# Patient Record
Sex: Male | Born: 1940 | Race: White | Hispanic: No | Marital: Married | Smoking: Never smoker
Health system: Southern US, Community
[De-identification: ages and names within clinical notes are randomized; demographics above are authoritative.]

## PROBLEM LIST (undated history)

## (undated) DIAGNOSIS — I729 Aneurysm of unspecified site: Secondary | ICD-10-CM

## (undated) DIAGNOSIS — R413 Other amnesia: Secondary | ICD-10-CM

## (undated) DIAGNOSIS — I219 Acute myocardial infarction, unspecified: Secondary | ICD-10-CM

## (undated) DIAGNOSIS — I2510 Atherosclerotic heart disease of native coronary artery without angina pectoris: Secondary | ICD-10-CM

## (undated) DIAGNOSIS — I4891 Unspecified atrial fibrillation: Secondary | ICD-10-CM

## (undated) DIAGNOSIS — C801 Malignant (primary) neoplasm, unspecified: Secondary | ICD-10-CM

## (undated) HISTORY — PX: OTHER SURGICAL HISTORY: SHX169

## (undated) HISTORY — PX: CORONARY ARTERY BYPASS GRAFT: SHX141

---

## 2004-08-29 ENCOUNTER — Inpatient Hospital Stay: Payer: Self-pay | Admitting: Rheumatology

## 2005-09-14 ENCOUNTER — Ambulatory Visit: Payer: Self-pay | Admitting: General Surgery

## 2010-03-04 ENCOUNTER — Ambulatory Visit: Payer: Self-pay | Admitting: Vascular Surgery

## 2010-03-10 ENCOUNTER — Inpatient Hospital Stay: Payer: Self-pay | Admitting: Internal Medicine

## 2010-05-19 ENCOUNTER — Ambulatory Visit: Payer: Self-pay | Admitting: General Surgery

## 2011-06-13 ENCOUNTER — Ambulatory Visit: Payer: Self-pay

## 2011-09-25 ENCOUNTER — Other Ambulatory Visit: Payer: Self-pay | Admitting: Vascular Surgery

## 2011-09-25 LAB — CREATININE, SERUM: EGFR (African American): >60

## 2011-09-25 LAB — BUN: BUN: 18 mg/dL (ref 7–18)

## 2011-09-27 ENCOUNTER — Ambulatory Visit: Payer: Self-pay | Admitting: Vascular Surgery

## 2011-12-16 ENCOUNTER — Inpatient Hospital Stay: Payer: Self-pay | Admitting: Internal Medicine

## 2011-12-16 LAB — CK TOTAL AND CKMB (NOT AT ARMC): CK-MB: 1.2 ng/mL (ref 0.5–3.6)

## 2011-12-16 LAB — COMPREHENSIVE METABOLIC PANEL
Albumin: 3.8 g/dL (ref 3.4–5.0)
Alkaline Phosphatase: 96 U/L (ref 50–136)
Anion Gap: 9 (ref 7–16)
BUN: 17 mg/dL (ref 7–18)
Calcium, Total: 9 mg/dL (ref 8.5–10.1)
EGFR (Non-African Amer.): >60
Glucose: 108 mg/dL — ABNORMAL HIGH (ref 65–99)
Osmolality: 285 (ref 275–301)
Potassium: 4 mmol/L (ref 3.5–5.1)
Sodium: 142 mmol/L (ref 136–145)
Total Protein: 7.5 g/dL (ref 6.4–8.2)

## 2011-12-16 LAB — TROPONIN I: Troponin-I: 0.02 ng/mL

## 2011-12-16 LAB — PRO B NATRIURETIC PEPTIDE: B-Type Natriuretic Peptide: 202 pg/mL — ABNORMAL HIGH (ref 0–125)

## 2011-12-16 LAB — CBC
MCH: 28.5 pg (ref 26.0–34.0)
Platelet: 252 10*3/uL (ref 150–440)
RBC: 4.86 10*6/uL (ref 4.40–5.90)
WBC: 8 10*3/uL (ref 3.8–10.6)

## 2011-12-16 LAB — APTT: Activated PTT: 29.7 secs (ref 23.6–35.9)

## 2011-12-17 LAB — CK TOTAL AND CKMB (NOT AT ARMC)
CK, Total: 68 U/L (ref 35–232)
CK, Total: 74 U/L (ref 35–232)

## 2011-12-17 LAB — BASIC METABOLIC PANEL
Anion Gap: 10 (ref 7–16)
Chloride: 108 mmol/L — ABNORMAL HIGH (ref 98–107)
Glucose: 95 mg/dL (ref 65–99)
Osmolality: 286 (ref 275–301)
Sodium: 143 mmol/L (ref 136–145)

## 2011-12-17 LAB — LIPID PANEL
HDL Cholesterol: 32 mg/dL — ABNORMAL LOW (ref 40–60)
Triglycerides: 96 mg/dL (ref 0–200)
VLDL Cholesterol, Calc: 19 mg/dL (ref 5–40)

## 2011-12-17 LAB — CBC WITH DIFFERENTIAL/PLATELET
Basophil %: 0.6 %
Eosinophil #: 0.2 10*3/uL (ref 0.0–0.7)
HGB: 12.4 g/dL — ABNORMAL LOW (ref 13.0–18.0)
MCH: 28.5 pg (ref 26.0–34.0)
MCV: 87 fL (ref 80–100)
Monocyte #: 0.8 10*3/uL — ABNORMAL HIGH (ref 0.0–0.7)
Monocyte %: 9.1 %
RBC: 4.35 10*6/uL — ABNORMAL LOW (ref 4.40–5.90)
RDW: 13.6 % (ref 11.5–14.5)

## 2011-12-17 LAB — APTT
Activated PTT: 113.5 secs — ABNORMAL HIGH (ref 23.6–35.9)
Activated PTT: 29.8 secs (ref 23.6–35.9)

## 2011-12-17 LAB — TROPONIN I
Troponin-I: 1.5 ng/mL — ABNORMAL HIGH
Troponin-I: 0.48 ng/mL — ABNORMAL HIGH

## 2013-06-07 ENCOUNTER — Ambulatory Visit: Payer: Self-pay | Admitting: Otolaryngology

## 2013-06-15 ENCOUNTER — Ambulatory Visit: Payer: Self-pay | Admitting: Radiation Oncology

## 2013-06-20 ENCOUNTER — Ambulatory Visit: Payer: Self-pay | Admitting: Radiation Oncology

## 2013-07-11 LAB — CBC CANCER CENTER
Basophil %: 0.9 %
Eosinophil #: 0.3 x10 3/mm (ref 0.0–0.7)
HCT: 43.9 % (ref 40.0–52.0)
HGB: 14.3 g/dL (ref 13.0–18.0)
Lymphocyte %: 32.5 %
MCH: 28.1 pg (ref 26.0–34.0)
MCHC: 32.7 g/dL (ref 32.0–36.0)
MCV: 86 fL (ref 80–100)
Monocyte %: 8.9 %
Neutrophil #: 4.6 x10 3/mm (ref 1.4–6.5)
Neutrophil %: 53.9 %
RBC: 5.11 10*6/uL (ref 4.40–5.90)

## 2013-07-14 ENCOUNTER — Ambulatory Visit: Payer: Self-pay | Admitting: Radiation Oncology

## 2013-07-18 LAB — CBC CANCER CENTER
Basophil %: 0.9 %
Eosinophil #: 0.3 x10 3/mm (ref 0.0–0.7)
Eosinophil %: 3.8 %
HCT: 43.1 % (ref 40.0–52.0)
HGB: 13.9 g/dL (ref 13.0–18.0)
MCH: 27.8 pg (ref 26.0–34.0)
MCHC: 32.1 g/dL (ref 32.0–36.0)
MCV: 86 fL (ref 80–100)
Neutrophil %: 54.4 %
RBC: 4.99 10*6/uL (ref 4.40–5.90)
RDW: 13.5 % (ref 11.5–14.5)

## 2013-07-25 LAB — CBC CANCER CENTER
Basophil %: 0.8 %
Eosinophil #: 0.2 x10 3/mm (ref 0.0–0.7)
Eosinophil %: 3.3 %
HCT: 42.7 % (ref 40.0–52.0)
Lymphocyte #: 1.9 x10 3/mm (ref 1.0–3.6)
MCHC: 32.4 g/dL (ref 32.0–36.0)
MCV: 86 fL (ref 80–100)
Monocyte #: 0.8 x10 3/mm (ref 0.2–1.0)
Monocyte %: 10.9 %
Neutrophil #: 4.3 x10 3/mm (ref 1.4–6.5)
Neutrophil %: 58.4 %
RBC: 4.98 10*6/uL (ref 4.40–5.90)
RDW: 13.2 % (ref 11.5–14.5)
WBC: 7.3 x10 3/mm (ref 3.8–10.6)

## 2013-08-01 LAB — CBC CANCER CENTER
Eosinophil #: 0.2 x10 3/mm (ref 0.0–0.7)
HCT: 43.6 % (ref 40.0–52.0)
Lymphocyte #: 1.8 x10 3/mm (ref 1.0–3.6)
Lymphocyte %: 25.4 %
MCHC: 32.5 g/dL (ref 32.0–36.0)
Monocyte #: 0.7 x10 3/mm (ref 0.2–1.0)
Monocyte %: 10.4 %
Neutrophil #: 4.2 x10 3/mm (ref 1.4–6.5)
Platelet: 227 x10 3/mm (ref 150–440)
RDW: 13.7 % (ref 11.5–14.5)
WBC: 7 x10 3/mm (ref 3.8–10.6)

## 2013-08-08 LAB — CBC CANCER CENTER
Basophil %: 0.9 %
Eosinophil #: 0.3 x10 3/mm (ref 0.0–0.7)
HGB: 13.9 g/dL (ref 13.0–18.0)
Lymphocyte #: 2 x10 3/mm (ref 1.0–3.6)
MCH: 28 pg (ref 26.0–34.0)
MCV: 86 fL (ref 80–100)
Platelet: 243 x10 3/mm (ref 150–440)
RDW: 13.4 % (ref 11.5–14.5)

## 2013-08-13 ENCOUNTER — Ambulatory Visit: Payer: Self-pay | Admitting: Radiation Oncology

## 2013-08-15 LAB — CBC CANCER CENTER
Basophil %: 0.8 %
Eosinophil #: 0.2 x10 3/mm (ref 0.0–0.7)
Eosinophil %: 2.5 %
Lymphocyte #: 1.9 x10 3/mm (ref 1.0–3.6)
Lymphocyte %: 20 %
MCH: 28.3 pg (ref 26.0–34.0)
MCHC: 32.7 g/dL (ref 32.0–36.0)
Monocyte #: 1.1 x10 3/mm — ABNORMAL HIGH (ref 0.2–1.0)
Neutrophil #: 6.1 x10 3/mm (ref 1.4–6.5)
Platelet: 257 x10 3/mm (ref 150–440)
RBC: 5.18 10*6/uL (ref 4.40–5.90)
RDW: 13.3 % (ref 11.5–14.5)

## 2013-09-13 ENCOUNTER — Ambulatory Visit: Payer: Self-pay | Admitting: Radiation Oncology

## 2014-02-05 ENCOUNTER — Ambulatory Visit: Payer: Self-pay | Admitting: Radiation Oncology

## 2014-08-06 ENCOUNTER — Ambulatory Visit: Payer: Self-pay | Admitting: Radiation Oncology

## 2014-08-15 ENCOUNTER — Ambulatory Visit: Payer: Self-pay | Admitting: Oncology

## 2014-09-13 ENCOUNTER — Ambulatory Visit: Payer: Self-pay | Admitting: Oncology

## 2015-01-03 NOTE — Consult Note (Signed)
Reason for Visit: This 74 year old Male patient presents to the clinic for initial evaluation of  laryngeal cancer .   Referred by Dr. Ladene Artist.  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 Over the past several months she's had a history of persistent hoarseness eventually saw ENT. Underwent direct microlaryngoscopy. At that time a right true cord mass described as a polyp was removed. This lesion was stripped from the anterior right cord. Cord mobility was good no evidence of disease and left true cord was noted. Biopsy was positive for followup with granulation tissue as well as squamous cell carcinoma.. Patient tolerated the procedure well. He is now referred to radiation oncology for consideration of treatment. He is having no head and neck pain or dysphagia. Still has some persistent raspiness of his voice.  Past Hx:    Cancer Right Vocal Cord:    Atrial Fibrillation:    Irregular Heart Beat:    HTN:   Past, Family and Social History:  Past Medical History positive   Cardiovascular atrial fibrillation; hyperlipidemia; hypertension; peripheral vascular disease   Cancer colon   Colon Cancer Treatment Details chemotherapy; radiation therapy   Last Colon Cancer Treatment 20 years prior   Colon Cancer Related Details in remission   Family History positive   Family History Comments family history of coronary artery disease and COPD   Social History positive   Social History Comments greater than  30-pack-year smoking history is had quit smoking for 20 years.   Additional Past Medical and Surgical History seen accompanied by his wife today   Allergies:   Penicillin: Unknown  Codeine: Unknown  Iodine Strong: Unknown  Shellfish: Unknown  Statins: Muscles aches  Home Meds:  Home Medications: Medication Instructions Status  aspirin 325 mg oral delayed release tablet 1 tab(s) orally once a day Active  Lopressor 25 milligram(s) orally 2 times a day Active  Vitamin B-12 2500 milligram(s) orally 3 x week.  Active   Review of Systems:  General negative   Performance Status (ECOG) 0   Skin negative   Breast negative   Ophthalmologic negative   ENMT see HPI   Respiratory and Thorax negative   Cardiovascular see HPI   Gastrointestinal see HPI   Genitourinary negative   Musculoskeletal negative   Neurological negative   Psychiatric negative   Hematology/Lymphatics negative   Endocrine negative   Allergic/Immunologic negative   Nursing Notes:  Nursing Vital Signs and Chemo Nursing Nursing Notes: *CC Vital Signs Flowsheet:   08-Oct-14 14:08  Temp Temperature 97  Pulse Pulse 68  Respirations Respirations 20  SBP SBP 145  DBP DBP 90  Pain Scale (0-10)  0  Current Weight (kg) (kg) 111.4  Height (cm) centimeters 180.9  BSA (m2) 2.3   Physical Exam:  General/Skin/HEENT:  General normal   Skin normal   Eyes normal   ENMT normal   Head and Neck normal   Additional PE well-developed well-nourished male in NAD. Oral cavity is clear he has upper and  lower dentures. No oral mucosal lesions are identified. Indirect mirror examination shows cords approximating well some erythema of the anterior right cord no discreet lesion noted. Upper airways clear vallecula and base of tongue within normal limits. No evidence of some digastric cervical or supraclavicular adenopathy is identified. Lungs are clear to A&P. Cardiac examination shows irregular irregular  heartbeat.   Breasts/Resp/CV/GI/GU:  Respiratory and Thorax normal   Gastrointestinal normal   Genitourinary normal   MS/Neuro/Psych/Lymph:  Musculoskeletal normal   Neurological normal   Lymphatics normal   Other Results:  Radiology Results: XRay:    04-Apr-13 18:41, Chest PA and Lateral  Chest PA and Lateral   REASON FOR EXAM:    pain  COMMENTS:   May transport without cardiac monitor    PROCEDURE: DXR - DXR CHEST PA (OR AP) AND LATERAL  - Dec 16 2011  6:41PM     RESULT: Comparison is made to the study of 10 March 2010.    The heart is normal in size. The lungs appear clear. There is no edema,   infiltrate, effusion or pneumothorax. The bony structures appear intact.    IMPRESSION:  No acute cardiopulmonary disease.          Verified By: Sundra Aland, M.D., MD  MRI:    14-Jan-13 12:46, MRA Neck [facility]  MRA Neck [facility]   REASON FOR EXAM:    carotid stenosis  pt allergic to CT dye per office  COMMENTS:       PROCEDURE: MR  - MRA NECK CAROTIDS WO/W  - Sep 27 2011 12:46PM       RESULT:     Comparison is made to a previous study dated 03/04/2010.    TECHNIQUE: Axial and coronal source imaging of the carotid system was   obtained as well as 3D reconstructions status post administration of 20   ml IV Multi-Hance.  The vertebral arteries are also included on delayed   images.    FINDINGS: When compared to the previous study, there has been increased   stenosis of the origin of the right internal carotid artery which now   appears to be no the magnitude of 75 to 80% stenosed. A second area of   stenosis is also identified within the proximal portion of the right   internal carotid artery which appears stable. There is no evidence of   focal out-pouchings or regions of fusiform dilatation within the right or   left carotid systems. The vertebral arteries appear patent.     IMPRESSION:      1.  Significantly increased stenosis of the origin of the right  internal   carotid artery and now appears to be of the magnitude of 75 to 80%.  2.  Stable, second tandem lesion of stenosis within the proximal right   internal carotid artery.    Thank you for this opportunity to contribute to the care of your patient.           Verified By: Mikki Santee, M.D., MD   Relevent Results:   Relevant Scans and Labs chest x-ray prior MRI scan of the neck are reviewed.   Assessment and Plan: Impression:   stage I squamous cell carcinoma of the larynx in 74 year old male with history of colon cancer 20 years prior in remission. Plan:   at this time I truly we are dealing with a stage I invasive [place] cell carcinoma of the larynx. Patient is allergic to contrast dye making CT  scan of his head and neck region with contrast of less clinical importance for his staging. Would do a CT scan as part of his simulation and review that for any abnormalities although the likelihood of any lymph node metastasis from a stage I laryngeal cancer is extremely low. I would plan on delivering 6600 cGy using external beam a radiation therapy. Risks and benefits of treatment including persistent hoarseness throughout treatments and several months after, skin reaction, spot dysphasia, over explained in detail to the patient and his wife. Both seem to comprehend my treatment plan well. I also do not believe a PET/CT scan is indicated based again on extremely low likelihood of any lymph node involvement of this disease. Patient was set up for CT simulation next week.  I would like to take this opportunity to thank you for allowing me to continue to participate in this patient's care.  CC Referral:  cc: Dr. Ladene Artist   Electronic Signatures: Baruch Gouty, Roda Shutters (MD)  (Signed 09-Oct-14 09:58)  Authored: HPI, Diagnosis, Past Hx, PFSH, Allergies, Home Meds, ROS, Nursing Notes, Physical Exam, Other Results, Relevent Results, Encounter Assessment and Plan, CC Referring Physician   Last  Updated: 09-Oct-14 09:58 by Armstead Peaks (MD)

## 2015-01-05 NOTE — Discharge Summary (Signed)
PATIENT NAME:  [name], [name]:  [id number] DATE OF BIRTH:  [birth year]  DATE OF ADMISSION:  12/16/2011 DATE OF DISCHARGE:  12/17/2011  FINAL DIAGNOSES:  1. Unstable angina secondary to coronary artery disease.  2. Atrial fibrillation.  3. Hyperlipidemia.  4. Peripheral vascular disease.  5. History of rectal cancer with resection remotely.   PRINCIPLE PROCEDURE: Cardiac catheterization 12/17/2011.   HISTORY AND PHYSICAL: Please see dictated admission history and physical.   Watson: The patient was admitted with episodes of chest pain with exertion, worrisome for unstable angina secondary to coronary artery disease. Cardiac enzymes were followed, troponin was minimally elevated, however MB fraction was normal, suggesting that he had a recent subendocardial myocardial infarction. He underwent cardiac catheterization as noted above, with 90% stenosis in the proximal LAD, 99% stenosis in the first diagonal, 80% stenosis in the ostial ramus intermedius, 80% proximal RCA stenosis with 50% mid RCA stenosis and 75% distal RCA stenosis. There was 80% stenosis in a right PDA as well. Cardiology contacted [facility] and the patient was transferred to that facility for coronary artery bypass surgery and was transferred in stable condition. Further treatment and follow-up is left to their discretion.   ____________________________ Adin Hector, MD bjk:rbg D: 12/26/2011 19:33:19 ET T: 12/27/2011 15:57:57 ET JOB#: [id number]  cc: Adin Hector, MD, <Dictator> Ramonita Lab MD ELECTRONICALLY SIGNED 12/28/2011 8:08

## 2015-01-05 NOTE — H&P (Signed)
PATIENT NAME:  Cameron Parrish, Cameron Parrish MR#:  [id number] DATE OF BIRTH:  1941/07/27  DATE OF ADMISSION:  12/16/2011  CHIEF COMPLAINT: Chest pain.   HISTORY OF PRESENT ILLNESS: 74 year old male with history of peripheral vascular disease, history of paroxysmal atrial fibrillation, who was in his usual state of health until about two days ago. He was exerting himself when he noticed onset of left arm discomfort and some substernal chest pressure. He sat down and this resolved over the course of about 15 to 30 minutes. He noticed no nausea, diaphoresis, palpitations, or significant dyspnea. Today he was doing some minor exertion when he had again onset of bilateral arm pain, this time radiating to left neck, again with substernal chest pain. It took several minutes for this to improve, and over the course of the days had at least two other episodes of very similar pain, also associated with any type of exertion, improved with rest. He did undergo a stress test in November 2012, which was noted to be abnormal based on onset of atrial fibrillation. He reports he gets palpitations with atrial fibrillation and that he did not feel this. He has had left ventricular hypertrophy versus incomplete left bundle branch block however, his current EKG now shows left bundle branch block, new from EKG obtained in 2011. He has history of carotid artery stenosis and abdominal aortic aneurysm which are both being monitored for now. He is statin intolerant. He is on beta blockers, but has not been able to tolerate much of a dose. He has been using aspirin daily. He comes in now with unstable angina secondary to coronary artery disease.   PAST MEDICAL HISTORY:  1. Paroxysmal atrial fibrillation.  2. Hyperlipidemia.  3. History of B12 and folate deficiency.  4. History of peripheral vascular disease. Carotid artery stenosis has been noted and confirmed by MRA, being monitored. 4 cm abdominal aortic aneurysm followed by ultrasound, last  January 2013.  5. History of rectal cancer with resection in 1994, status post chemotherapy and radiation therapy. Colonoscopy within the last two years showing no recurrence.   ALLERGIES: Penicillin, Zetia, statins, iodine and codeine.   MEDICATIONS:  1. Metoprolol succinate 12.5 mg p.o. daily.  2. Aspirin 81 mg p.o. daily.   SOCIAL HISTORY: Remote tobacco. No alcohol.   FAMILY HISTORY: Coronary artery disease and chronic obstructive pulmonary disease.   REVIEW OF SYSTEMS: Please see history of present illness. No vision changes; no dysphagia; no cough; no recent bowel or bladder changes. Some chronic leg edema, which was unchanged. Remainder of complete review of systems is negative.   PHYSICAL EXAMINATION:  VITAL SIGNS: Temperature 98.4, pulse 72, blood pressure initially 188/100, saturation 93% on room air, weight 99.7 kg.   GENERAL: Well-developed, well-nourished male, no distress.   EYES: Pupils round and reactive to light. Lids and conjunctiva are unremarkable.   EAR, NOSE, THROAT: External examination unremarkable. The oropharynx is moist without lesions.   NECK: Supple. Trachea midline. No thyromegaly.   CARDIOVASCULAR: Bradycardic without gallops, rubs. Soft 1/6 systolic murmur. Carotid pulses 2+ proximally. Radial pulses 1+. Distal pulses 2+.   LUNGS: Clear bilaterally. No retractions.   ABDOMEN: Soft, nontender, nondistended. Positive bowel sounds. No guard or rebound.    SKIN: No significant rashes or nodules.   LYMPH NODES: No cervical or supraclavicular nodes.   MUSCULOSKELETAL: No clubbing or cyanosis. Trace ankle edema with varicosities. Negative Homans.   NEUROLOGIC: Cranial nerves intact. Motor strength appears to be symmetrical.   LABORATORY, DIAGNOSTIC, AND  RADIOLOGICAL DATA: EKG reveals left bundle branch block, new from 2011 EKG. There are no ST or T wave changes noted in comparison to that EKG. BNP minimally elevated at 202. Initial cardiac enzymes  negative. Creatinine 1.14 with glucose 108, liver enzymes unremarkable. White count 8 with hemoglobin 14 and platelets 252. Chest x-ray reveals no acute cardiopulmonary disease.   IMPRESSION AND PLAN:  1. Chest pain, probable unstable angina secondary to coronary artery disease. Continue low-dose beta blocker which is all he can tolerate. He does not tolerate statins. Place on heparin, continue aspirin. Cardiology consultation as I suspect he will need to go directly to cardiac catheterization given his extremely high risk for significant coronary artery disease. Will use morphine as needed, and the potential for interaction with codeine was discussed, however, this appeared to be more nausea and vomiting rather than true allergy. He is aware that he would need to be premedicated with steroids given his iodine allergy and he and his wife are comfortable with this if necessary.  2. Paroxysmal atrial fibrillation. Again he gets symptomatic with atrial fibrillation. Will follow on the monitor, no evidence of atrial fibrillation contributing to his current symptoms.    ____________________________ Adin Hector, MD bjk:cms D: 12/16/2011 20:15:30 ET T: 12/17/2011 06:20:51 ET JOB#: [id number]  cc: Adin Hector, MD, <Dictator> Ramonita Lab MD ELECTRONICALLY SIGNED 12/26/2011 19:36

## 2015-01-05 NOTE — Consult Note (Signed)
PATIENT NAME:  Cameron Parrish, Cameron Parrish MR#:  [id number] DATE OF BIRTH:  1940/12/14  DATE OF CONSULTATION:  12/17/2011  REFERRING PHYSICIAN:  Ramonita Lab, MD CONSULTING PHYSICIAN:  Isaias Cowman, MD  CHIEF COMPLAINT: Chest pain.   HISTORY OF PRESENT ILLNESS: The patient is a 74 year old gentleman with history of paroxysmal atrial fibrillation who was admitted with chest pain. The patient reports that he was in his usual state of health until 12/16/2011 when he experienced substernal chest discomfort radiating to his left arm and neck, which lasted approximately 30 minutes. The patient presented to [facility] Emergency Room where EKG revealed atrial fibrillation without any acute ischemic ST-T wave changes. The patient has ruled in for non-ST elevation myocardial infarction with a troponin of 1.5. The patient has remained chest pain free.   PAST MEDICAL HISTORY:  1. Paroxysmal atrial fibrillation.  2. Known carotid artery disease.  3. 4-cm abdominal aortic aneurysm.  4. Hyperlipidemia.  5. History of rectal cancer, status post resection 1994.   MEDICATIONS ON ADMISSION:  1. Aspirin 81 mg daily.  2. Metoprolol succinate 12.5 mg daily.   SOCIAL HISTORY: The patient is married and lives with his wife. Denies tobacco or EtOH abuse.   FAMILY HISTORY: The patient has known family history of coronary artery disease.     REVIEW OF SYSTEMS: CONSTITUTIONAL: No fever or chills. EYES: No blurry vision.  EARS: No hearing loss. RESPIRATORY: No shortness of breath. CARDIOVASCULAR: Chest pain as described above. GASTROINTESTINAL: No nausea, vomiting, diarrhea, or constipation. GU: No dysuria or hematuria. ENDOCRINE: No polyuria or polydipsia. MUSCULOSKELETAL: No arthralgias or myalgias. NEUROLOGICAL: No focal muscle weakness or numbness. PSYCHOLOGICAL: No depression or anxiety.   PHYSICAL EXAMINATION:  VITAL SIGNS: Blood pressure 150/68, pulse 70, respirations 16, temperature 98, and pulse oximetry 94%.   HEENT:  Pupils equal, reactive to light and accommodation.   NECK: Supple without thyromegaly.   LUNGS: Clear.   HEART: Normal jugular venous pressure. Normal point of maximal impulse. Irregularly irregular rhythm. Normal S1, S2. No appreciable gallop, murmur, or rub.   ABDOMEN: Soft and nontender. Pulses were intact bilaterally.   MUSCULOSKELETAL: Normal muscle tone.   NEUROLOGIC: The patient is alert and oriented times three. Motor and sensory are both grossly intact.   IMPRESSION: This is a 74 year old gentleman with history of paroxysmal atrial fibrillation who presents with new onset chest pain, has ruled in for non-ST elevation myocardial infarction.   RECOMMENDATIONS:  1. Agree with current therapy.  2. Proceed with cardiac catheterization with selective coronary arteriography. The risks, benefits, and alternatives were explained and informed written consent obtained.   ____________________________ Isaias Cowman, MD ap:bjt D: 12/17/2011 08:46:20 ET T: 12/17/2011 10:14:41 ET JOB#: [id number]  cc: Isaias Cowman, MD, <Dictator> Isaias Cowman MD ELECTRONICALLY SIGNED 12/29/2011 8:46

## 2015-01-24 ENCOUNTER — Other Ambulatory Visit: Payer: Self-pay | Admitting: Vascular Surgery

## 2015-01-24 DIAGNOSIS — I714 Abdominal aortic aneurysm, without rupture, unspecified: Secondary | ICD-10-CM

## 2015-01-30 ENCOUNTER — Ambulatory Visit: Admission: RE | Admit: 2015-01-30 | Payer: Self-pay | Source: Ambulatory Visit

## 2015-01-30 ENCOUNTER — Ambulatory Visit
Admission: RE | Admit: 2015-01-30 | Discharge: 2015-01-30 | Disposition: A | Discharge status: Home / Self Care | Payer: Medicare Other | Source: Ambulatory Visit | Attending: Vascular Surgery | Admitting: Vascular Surgery

## 2015-01-30 DIAGNOSIS — M8580 Other specified disorders of bone density and structure, unspecified site: Secondary | ICD-10-CM | POA: Insufficient documentation

## 2015-01-30 DIAGNOSIS — I714 Abdominal aortic aneurysm, without rupture, unspecified: Secondary | ICD-10-CM

## 2015-01-30 DIAGNOSIS — I723 Aneurysm of iliac artery: Secondary | ICD-10-CM | POA: Insufficient documentation

## 2015-01-30 DIAGNOSIS — K4020 Bilateral inguinal hernia, without obstruction or gangrene, not specified as recurrent: Secondary | ICD-10-CM | POA: Insufficient documentation

## 2015-01-30 DIAGNOSIS — K8020 Calculus of gallbladder without cholecystitis without obstruction: Secondary | ICD-10-CM | POA: Insufficient documentation

## 2015-01-30 DIAGNOSIS — N281 Cyst of kidney, acquired: Secondary | ICD-10-CM | POA: Diagnosis not present

## 2015-01-30 HISTORY — DX: Malignant (primary) neoplasm, unspecified: C80.1

## 2015-01-30 MED ORDER — IOHEXOL 350 MG/ML SOLN
100.0000 mL | Freq: Once | INTRAVENOUS | Status: AC | PRN
  Administered 2015-01-30: 100 mL via INTRAVENOUS

## 2015-02-14 ENCOUNTER — Ambulatory Visit: Payer: Self-pay

## 2015-02-14 ENCOUNTER — Ambulatory Visit: Payer: Self-pay | Admitting: Radiation Oncology

## 2015-02-19 ENCOUNTER — Ambulatory Visit
Admission: RE | Admit: 2015-02-19 | Discharge: 2015-02-19 | Disposition: A | Discharge status: Home / Self Care | Payer: Medicare Other | Source: Ambulatory Visit | Attending: Radiation Oncology | Admitting: Radiation Oncology

## 2015-02-19 ENCOUNTER — Encounter: Payer: Self-pay | Admitting: Radiation Oncology

## 2015-02-19 VITALS — BP 125/80 | HR 63 | Temp 96.7°F | Ht 71.0 in | Wt 234.6 lb

## 2015-02-19 DIAGNOSIS — C329 Malignant neoplasm of larynx, unspecified: Secondary | ICD-10-CM

## 2015-02-19 NOTE — Progress Notes (Signed)
Radiation Oncology Follow up Note  Name: Cameron CARRUTHERS Sr.   Date:   02/19/2015 MRN:  [id number] DOB: [birth year]    This 74 y.o. male presents to the clinic today for stage I (T1 N0 M0) squamous cell carcinoma the larynx follow-up.  REFERRING PROVIDER: Adin Hector, MD  HPI: Patient is a 74 year old male previously treated over 20 years prior for rectal cancer in our department now close to 2 years out having completed radiation therapy to his larynx for a stage I squamous cell carcinoma. He is seen today in routine follow-up and is doing well. He has excellent toe now any to his voice. He specifically denies head and neck pain or dysphagia. He is scheduled to undergo an aortic abdominal aneurysm repair in the near future. He is under close follow-up by ENT. COMPLICATIONS OF TREATMENT: none  FOLLOW UP COMPLIANCE: keeps appointments   PHYSICAL EXAM:  BP 125/80 mmHg  Pulse 63  Temp(Src) 96.7 F (35.9 C)  Ht 5\' 11"  (1.803 m)  Wt 234 lb 9.1 oz (106.4 kg)  BMI 32.73 kg/m2 Oral cavity is clear with no oral mucosal lesions noted. Indirect mirror examination shows base of tongue vallecula within normal limits. Cords approximate well. No evidence of mass or nodularity in the larynx is identified. Neck is clear without evidence of subject I gastric cervical or supraclavicular adenopathy. Well-developed well-nourished patient in NAD. HEENT reveals PERLA, EOMI, discs not visualized.  Oral cavity is clear. No oral mucosal lesions are identified. Neck is clear without evidence of cervical or supraclavicular adenopathy. Lungs are clear to A&P. Cardiac examination is essentially unremarkable with regular rate and rhythm without murmur rub or thrill. Abdomen is benign with no organomegaly or masses noted. Motor sensory and DTR levels are equal and symmetric in the upper and lower extremities. Cranial nerves II through XII are grossly intact. Proprioception is intact. No peripheral adenopathy or edema is  identified. No motor or sensory levels are noted. Crude visual fields are within normal range.   RADIOLOGY RESULTS: Only recent CT scans of the abdomen and pelvis for workup of his aneurysm are reviewed.  PLAN: At the present time he continues to do well from head and neck standpoint with no evidence of disease. I'm please was overall progress. He continues to well. I wished him luck with his aneurysm surgery. I have asked to see him back in 1 year for follow-up. He continues close follow-up care with ENT.  I would like to take this opportunity for allowing me to participate in the care of your patient.Armstead Peaks., MD

## 2015-03-03 ENCOUNTER — Encounter
Admission: RE | Admit: 2015-03-03 | Discharge: 2015-03-03 | Disposition: A | Discharge status: Home / Self Care | Payer: Medicare Other | Source: Ambulatory Visit | Attending: Vascular Surgery | Admitting: Vascular Surgery

## 2015-03-03 DIAGNOSIS — I4891 Unspecified atrial fibrillation: Secondary | ICD-10-CM | POA: Insufficient documentation

## 2015-03-03 DIAGNOSIS — R413 Other amnesia: Secondary | ICD-10-CM | POA: Diagnosis not present

## 2015-03-03 DIAGNOSIS — I252 Old myocardial infarction: Secondary | ICD-10-CM | POA: Insufficient documentation

## 2015-03-03 DIAGNOSIS — I2510 Atherosclerotic heart disease of native coronary artery without angina pectoris: Secondary | ICD-10-CM | POA: Insufficient documentation

## 2015-03-03 DIAGNOSIS — Z01812 Encounter for preprocedural laboratory examination: Secondary | ICD-10-CM | POA: Diagnosis present

## 2015-03-03 DIAGNOSIS — I10 Essential (primary) hypertension: Secondary | ICD-10-CM | POA: Insufficient documentation

## 2015-03-03 DIAGNOSIS — I729 Aneurysm of unspecified site: Secondary | ICD-10-CM | POA: Insufficient documentation

## 2015-03-03 DIAGNOSIS — C320 Malignant neoplasm of glottis: Secondary | ICD-10-CM | POA: Diagnosis not present

## 2015-03-03 HISTORY — DX: Aneurysm of unspecified site: I72.9

## 2015-03-03 HISTORY — DX: Atherosclerotic heart disease of native coronary artery without angina pectoris: I25.10

## 2015-03-03 HISTORY — DX: Acute myocardial infarction, unspecified: I21.9

## 2015-03-03 HISTORY — DX: Unspecified atrial fibrillation: I48.91

## 2015-03-03 HISTORY — DX: Other amnesia: R41.3

## 2015-03-03 LAB — BASIC METABOLIC PANEL
ANION GAP: 3 — AB (ref 5–15)
BUN: 13 mg/dL (ref 6–20)
CALCIUM: 8.5 mg/dL — AB (ref 8.9–10.3)
CO2: 26 mmol/L (ref 22–32)
Chloride: 110 mmol/L (ref 101–111)
Creatinine, Ser: 0.92 mg/dL (ref 0.61–1.24)
GFR calc Af Amer: >60 mL/min (ref >60)
Glucose, Bld: 99 mg/dL (ref 65–99)
POTASSIUM: 4.2 mmol/L (ref 3.5–5.1)
Sodium: 139 mmol/L (ref 135–145)

## 2015-03-03 LAB — TYPE AND SCREEN
ABO/RH(D): B POS
Antibody Screen: NEGATIVE

## 2015-03-03 LAB — ABO/RH: ABO/RH(D): B POS

## 2015-03-03 LAB — PROTIME-INR
INR: 1.05
PROTHROMBIN TIME: 13.9 s (ref 11.4–15.0)

## 2015-03-03 LAB — APTT: aPTT: 30 seconds (ref 24–36)

## 2015-03-03 LAB — CBC
HEMATOCRIT: 40.2 % (ref 40.0–52.0)
HEMOGLOBIN: 13.3 g/dL (ref 13.0–18.0)
MCH: 28.7 pg (ref 26.0–34.0)
MCHC: 33.1 g/dL (ref 32.0–36.0)
MCV: 86.6 fL (ref 80.0–100.0)
Platelets: 204 10*3/uL (ref 150–440)
RBC: 4.65 MIL/uL (ref 4.40–5.90)
RDW: 13.7 % (ref 11.5–14.5)
WBC: 7.1 10*3/uL (ref 3.8–10.6)

## 2015-03-03 NOTE — Patient Instructions (Signed)
  Your procedure is scheduled on: 03/12/15 Wed Report to Day Surgery. To find out your arrival time please call [phone] between 1PM - 3PM on 03/11/15 Tues.  Remember: Instructions that are not followed completely may result in serious medical risk, up to and including death, or upon the discretion of your surgeon and anesthesiologist your surgery may need to be rescheduled.    _x___ 1. Do not eat food or drink liquids after midnight. No gum chewing or hard candies.     ____ 2. No Alcohol for 24 hours before or after surgery.   ____ 3. Bring all medications with you on the day of surgery if instructed.    __x__ 4. Notify your doctor if there is any change in your medical condition     (cold, fever, infections).     Do not wear jewelry, make-up, hairpins, clips or nail polish.  Do not wear lotions, powders, or perfumes. You may wear deodorant.  Do not shave 48 hours prior to surgery. Men may shave face and neck.  Do not bring valuables to the hospital.    The Surgery Center is not responsible for any belongings or valuables.               Contacts, dentures or bridgework may not be worn into surgery.  Leave your suitcase in the car. After surgery it may be brought to your room.  For patients admitted to the hospital, discharge time is determined by your                treatment team.   Patients discharged the day of surgery will not be allowed to drive home.   Please read over the following fact sheets that you were given:      ____ Take these medicines the morning of surgery with A SIP OF WATER:  x  1.metoprolol tartrate (LOPRESSOR) 25 MG tablet  2.   3.   4.  5.  6.  ____ Fleet Enema (as directed)   _x___ Use CHG Soap as directed  ____ Use inhalers on the day of surgery  ____ Stop metformin 2 days prior to surgery    ____ Take 1/2 of usual insulin dose the night before surgery and none on the morning of surgery.   __x__ Stop Coumadin/Plavix/aspirin on Stop aspirin 3  days before surgery  ____ Stop Anti-inflammatories on    ____ Stop supplements until after surgery.    ____ Bring C-Pap to the hospital.

## 2015-03-12 ENCOUNTER — Encounter: Admission: RE | Payer: Self-pay | Source: Ambulatory Visit

## 2015-03-12 ENCOUNTER — Encounter: Payer: Self-pay | Admitting: *Deleted

## 2015-03-12 ENCOUNTER — Ambulatory Visit: Payer: Medicare Other | Admitting: Certified Registered Nurse Anesthetist

## 2015-03-12 ENCOUNTER — Inpatient Hospital Stay: Admission: RE | Admit: 2015-03-12 | Payer: Medicare Other | Source: Ambulatory Visit | Admitting: Vascular Surgery

## 2015-03-12 ENCOUNTER — Encounter
Admission: RE | Disposition: A | Discharge status: Home / Self Care | Payer: Self-pay | Source: Ambulatory Visit | Attending: Vascular Surgery

## 2015-03-12 ENCOUNTER — Inpatient Hospital Stay
Admission: RE | Admit: 2015-03-12 | Discharge: 2015-03-13 | DRG: 269 | Disposition: A | Discharge status: Home / Self Care | Payer: Medicare Other | Source: Ambulatory Visit | Attending: Vascular Surgery | Admitting: Vascular Surgery

## 2015-03-12 DIAGNOSIS — Z951 Presence of aortocoronary bypass graft: Secondary | ICD-10-CM | POA: Diagnosis not present

## 2015-03-12 DIAGNOSIS — I252 Old myocardial infarction: Secondary | ICD-10-CM

## 2015-03-12 DIAGNOSIS — I714 Abdominal aortic aneurysm, without rupture, unspecified: Secondary | ICD-10-CM | POA: Diagnosis present

## 2015-03-12 DIAGNOSIS — Z880 Allergy status to penicillin: Secondary | ICD-10-CM

## 2015-03-12 DIAGNOSIS — I2510 Atherosclerotic heart disease of native coronary artery without angina pectoris: Secondary | ICD-10-CM | POA: Diagnosis present

## 2015-03-12 HISTORY — PX: PERIPHERAL VASCULAR CATHETERIZATION: SHX172C

## 2015-03-12 LAB — CREATININE, SERUM: Creatinine, Ser: 0.96 mg/dL (ref 0.61–1.24)

## 2015-03-12 LAB — CBC
HCT: 39.5 % — ABNORMAL LOW (ref 40.0–52.0)
Hemoglobin: 12.8 g/dL — ABNORMAL LOW (ref 13.0–18.0)
MCH: 28.1 pg (ref 26.0–34.0)
MCHC: 32.4 g/dL (ref 32.0–36.0)
MCV: 86.9 fL (ref 80.0–100.0)
Platelets: 199 10*3/uL (ref 150–440)
RBC: 4.54 MIL/uL (ref 4.40–5.90)
RDW: 13.2 % (ref 11.5–14.5)
WBC: 8.6 10*3/uL (ref 3.8–10.6)

## 2015-03-12 LAB — MRSA PCR SCREENING: MRSA by PCR: NEGATIVE

## 2015-03-12 LAB — GLUCOSE, CAPILLARY: GLUCOSE-CAPILLARY: 99 mg/dL (ref 65–99)

## 2015-03-12 SURGERY — ENDOVASCULAR REPAIR/STENT GRAFT
Anesthesia: General

## 2015-03-12 SURGERY — ENDOVASCULAR STENT GRAFT INSERTION
Anesthesia: General

## 2015-03-12 SURGERY — ENDOVASCULAR REPAIR/STENT GRAFT
Anesthesia: Moderate Sedation

## 2015-03-12 MED ORDER — METHYLPREDNISOLONE SODIUM SUCC 125 MG IJ SOLR: 125.0000 mg | Freq: Once | INTRAMUSCULAR | Status: DC

## 2015-03-12 MED ORDER — NITROGLYCERIN IN D5W 200-5 MCG/ML-% IV SOLN: 5.0000 ug/min | INTRAVENOUS | Status: DC

## 2015-03-12 MED ORDER — VITAMIN B-12 1000 MCG PO TABS
1000.0000 ug | ORAL_TABLET | ORAL | Status: AC
  Administered 2015-03-12: 1000 ug via ORAL
  Filled 2015-03-12: qty 1

## 2015-03-12 MED ORDER — LACTATED RINGERS IV SOLN
INTRAVENOUS | Status: DC
  Administered 2015-03-12 (×2): via INTRAVENOUS

## 2015-03-12 MED ORDER — FENTANYL CITRATE (PF) 100 MCG/2ML IJ SOLN
INTRAMUSCULAR | Status: DC | PRN
  Administered 2015-03-12: 250 ug via INTRAVENOUS

## 2015-03-12 MED ORDER — ACETAMINOPHEN 10 MG/ML IV SOLN
INTRAVENOUS | Status: AC
  Filled 2015-03-12: qty 100

## 2015-03-12 MED ORDER — ONDANSETRON HCL 4 MG/2ML IJ SOLN: 4.0000 mg | Freq: Once | INTRAMUSCULAR | Status: DC | PRN

## 2015-03-12 MED ORDER — MAGNESIUM SULFATE 2 GM/50ML IV SOLN
2.0000 g | Freq: Every day | INTRAVENOUS | Status: DC | PRN
Start: 2015-03-12 — End: 2015-03-13

## 2015-03-12 MED ORDER — NITROGLYCERIN IN D5W 200-5 MCG/ML-% IV SOLN
INTRAVENOUS | Status: AC
  Filled 2015-03-12: qty 250

## 2015-03-12 MED ORDER — METOPROLOL TARTRATE 25 MG PO TABS
25.0000 mg | ORAL_TABLET | Freq: Two times a day (BID) | ORAL | Status: DC
  Administered 2015-03-12 – 2015-03-13 (×2): 25 mg via ORAL
  Filled 2015-03-12 (×2): qty 1

## 2015-03-12 MED ORDER — IOHEXOL 300 MG/ML  SOLN
INTRAMUSCULAR | Status: DC | PRN
  Administered 2015-03-12: 60 mL via INTRA_ARTERIAL

## 2015-03-12 MED ORDER — SODIUM CHLORIDE 0.9 % IV SOLN: INTRAVENOUS | Status: DC

## 2015-03-12 MED ORDER — PROPOFOL 10 MG/ML IV BOLUS
INTRAVENOUS | Status: DC | PRN
  Administered 2015-03-12: 150 mg via INTRAVENOUS

## 2015-03-12 MED ORDER — HYDROMORPHONE HCL 1 MG/ML IJ SOLN
INTRAMUSCULAR | Status: AC
  Filled 2015-03-12: qty 1

## 2015-03-12 MED ORDER — GUAIFENESIN-DM 100-10 MG/5ML PO SYRP: 15.0000 mL | ORAL_SOLUTION | ORAL | Status: DC | PRN

## 2015-03-12 MED ORDER — TRAMADOL HCL 50 MG PO TABS: 50.0000 mg | ORAL_TABLET | Freq: Four times a day (QID) | ORAL | Status: DC | PRN

## 2015-03-12 MED ORDER — ROCURONIUM BROMIDE 100 MG/10ML IV SOLN
INTRAVENOUS | Status: DC | PRN
  Administered 2015-03-12: 50 mg via INTRAVENOUS
  Administered 2015-03-12: 15 mg via INTRAVENOUS

## 2015-03-12 MED ORDER — ONDANSETRON HCL 4 MG/2ML IJ SOLN
4.0000 mg | Freq: Four times a day (QID) | INTRAMUSCULAR | Status: DC | PRN
  Administered 2015-03-12: 4 mg via INTRAVENOUS
  Filled 2015-03-12: qty 2

## 2015-03-12 MED ORDER — ATROPINE SULFATE 0.1 MG/ML IJ SOLN
INTRAMUSCULAR | Status: AC
  Filled 2015-03-12: qty 10

## 2015-03-12 MED ORDER — HEPARIN SODIUM (PORCINE) 5000 UNIT/ML IJ SOLN
INTRAMUSCULAR | Status: DC | PRN
  Administered 2015-03-12: 5000 [IU] via SUBCUTANEOUS

## 2015-03-12 MED ORDER — METHYLPREDNISOLONE SODIUM SUCC 125 MG IJ SOLR
INTRAMUSCULAR | Status: AC
  Administered 2015-03-12: 125 mg
  Filled 2015-03-12: qty 2

## 2015-03-12 MED ORDER — FAMOTIDINE 20 MG PO TABS
ORAL_TABLET | ORAL | Status: AC
  Administered 2015-03-12: 20 mg via ORAL
  Filled 2015-03-12: qty 1

## 2015-03-12 MED ORDER — ACETAMINOPHEN 10 MG/ML IV SOLN
INTRAVENOUS | Status: DC | PRN
  Administered 2015-03-12: 1000 mg via INTRAVENOUS

## 2015-03-12 MED ORDER — POTASSIUM CHLORIDE CRYS ER 20 MEQ PO TBCR: 20.0000 meq | EXTENDED_RELEASE_TABLET | Freq: Every day | ORAL | Status: DC | PRN

## 2015-03-12 MED ORDER — HYDROMORPHONE HCL 1 MG/ML IJ SOLN
0.2500 mg | INTRAMUSCULAR | Status: DC | PRN
  Administered 2015-03-12: 0.25 mg via INTRAVENOUS

## 2015-03-12 MED ORDER — DOCUSATE SODIUM 100 MG PO CAPS
100.0000 mg | ORAL_CAPSULE | Freq: Every day | ORAL | Status: DC
  Administered 2015-03-13: 100 mg via ORAL
  Filled 2015-03-12: qty 1

## 2015-03-12 MED ORDER — MORPHINE SULFATE 4 MG/ML IJ SOLN: 2.0000 mg | INTRAMUSCULAR | Status: DC | PRN

## 2015-03-12 MED ORDER — LIDOCAINE HCL (CARDIAC) 20 MG/ML IV SOLN
INTRAVENOUS | Status: DC | PRN
  Administered 2015-03-12: 100 mg via INTRAVENOUS

## 2015-03-12 MED ORDER — PHENOL 1.4 % MT LIQD: 1.0000 | OROMUCOSAL | Status: DC | PRN

## 2015-03-12 MED ORDER — SODIUM CHLORIDE 0.9 % IV SOLN
10000.0000 ug | INTRAVENOUS | Status: DC | PRN
  Administered 2015-03-12: 15 ug/min via INTRAVENOUS

## 2015-03-12 MED ORDER — CETYLPYRIDINIUM CHLORIDE 0.05 % MT LIQD
7.0000 mL | Freq: Two times a day (BID) | OROMUCOSAL | Status: DC
  Administered 2015-03-12 – 2015-03-13 (×2): 7 mL via OROMUCOSAL

## 2015-03-12 MED ORDER — DESFLURANE IN SOLN
RESPIRATORY_TRACT | Status: DC | PRN
  Administered 2015-03-12: 4 % via RESPIRATORY_TRACT

## 2015-03-12 MED ORDER — LABETALOL HCL 5 MG/ML IV SOLN
10.0000 mg | INTRAVENOUS | Status: AC | PRN
  Administered 2015-03-12 (×4): 10 mg via INTRAVENOUS
  Filled 2015-03-12 (×2): qty 4

## 2015-03-12 MED ORDER — ENOXAPARIN SODIUM 40 MG/0.4ML ~~LOC~~ SOLN
40.0000 mg | SUBCUTANEOUS | Status: DC
  Administered 2015-03-13: 40 mg via SUBCUTANEOUS
  Filled 2015-03-12: qty 0.4

## 2015-03-12 MED ORDER — ASPIRIN 325 MG PO TABS
325.0000 mg | ORAL_TABLET | Freq: Every day | ORAL | Status: DC
  Administered 2015-03-12 – 2015-03-13 (×2): 325 mg via ORAL
  Filled 2015-03-12 (×3): qty 1

## 2015-03-12 MED ORDER — CLINDAMYCIN PHOSPHATE 300 MG/50ML IV SOLN
INTRAVENOUS | Status: AC
  Administered 2015-03-12: 300 mg via INTRAVENOUS
  Filled 2015-03-12: qty 50

## 2015-03-12 MED ORDER — METOPROLOL TARTRATE 1 MG/ML IV SOLN: 2.0000 mg | INTRAVENOUS | Status: DC | PRN

## 2015-03-12 MED ORDER — HEPARIN (PORCINE) IN NACL 2-0.9 UNIT/ML-% IJ SOLN
INTRAMUSCULAR | Status: AC
  Filled 2015-03-12: qty 1500

## 2015-03-12 MED ORDER — LABETALOL HCL 5 MG/ML IV SOLN
INTRAVENOUS | Status: DC | PRN
  Administered 2015-03-12: 15 mg via INTRAVENOUS

## 2015-03-12 MED ORDER — SUGAMMADEX SODIUM 500 MG/5ML IV SOLN
INTRAVENOUS | Status: DC | PRN
  Administered 2015-03-12: 400 mg via INTRAVENOUS

## 2015-03-12 MED ORDER — ACETAMINOPHEN 325 MG PO TABS
325.0000 mg | ORAL_TABLET | ORAL | Status: DC | PRN
  Filled 2015-03-12: qty 1

## 2015-03-12 MED ORDER — ALUM & MAG HYDROXIDE-SIMETH 200-200-20 MG/5ML PO SUSP
15.0000 mL | ORAL | Status: DC | PRN
Start: 2015-03-12 — End: 2015-03-13

## 2015-03-12 MED ORDER — MIDAZOLAM HCL 2 MG/2ML IJ SOLN
INTRAMUSCULAR | Status: DC | PRN
  Administered 2015-03-12: 1 mg via INTRAVENOUS

## 2015-03-12 MED ORDER — FAMOTIDINE IN NACL 20-0.9 MG/50ML-% IV SOLN
20.0000 mg | Freq: Two times a day (BID) | INTRAVENOUS | Status: DC
  Administered 2015-03-12 – 2015-03-13 (×3): 20 mg via INTRAVENOUS
  Filled 2015-03-12 (×5): qty 50

## 2015-03-12 MED ORDER — SODIUM CHLORIDE 0.9 % IJ SOLN
INTRAMUSCULAR | Status: AC
  Administered 2015-03-12: 3 mL
  Filled 2015-03-12: qty 3

## 2015-03-12 MED ORDER — CLINDAMYCIN PHOSPHATE 300 MG/50ML IV SOLN: 300.0000 mg | Freq: Once | INTRAVENOUS | Status: DC

## 2015-03-12 MED ORDER — SODIUM CHLORIDE 0.9 % IV SOLN: 500.0000 mL | Freq: Once | INTRAVENOUS | Status: AC | PRN

## 2015-03-12 MED ORDER — HYDRALAZINE HCL 20 MG/ML IJ SOLN: 5.0000 mg | INTRAMUSCULAR | Status: DC | PRN

## 2015-03-12 MED ORDER — FAMOTIDINE 20 MG PO TABS
20.0000 mg | ORAL_TABLET | Freq: Once | ORAL | Status: AC
  Administered 2015-03-12: 20 mg via ORAL

## 2015-03-12 MED ORDER — CLINDAMYCIN PHOSPHATE 600 MG/50ML IV SOLN
600.0000 mg | Freq: Three times a day (TID) | INTRAVENOUS | Status: AC
  Administered 2015-03-12 (×2): 600 mg via INTRAVENOUS
  Filled 2015-03-12 (×2): qty 50

## 2015-03-12 MED ORDER — PHENYLEPHRINE HCL 10 MG/ML IJ SOLN
INTRAMUSCULAR | Status: DC | PRN
  Administered 2015-03-12: 50 ug via INTRAVENOUS
  Administered 2015-03-12 (×2): 100 ug via INTRAVENOUS

## 2015-03-12 MED ORDER — ACETAMINOPHEN 650 MG RE SUPP: 325.0000 mg | RECTAL | Status: DC | PRN

## 2015-03-12 MED FILL — Desflurane Inhal Soln: RESPIRATORY_TRACT | Qty: 240 | Status: AC

## 2015-03-12 SURGICAL SUPPLY — 28 items
BALLN CODA OCL 2-9.0-35-120-3 (BALLOONS) ×3
BALLOON COD OCL 2-9.0-35-120-3 (BALLOONS) ×1 IMPLANT
CATH ANGIO PIGTAIL 5FR 100 (CATHETERS) ×1 IMPLANT
CATH BEACON 5.038 65CM KMP-01 (CATHETERS) ×3 IMPLANT
CATH PIG 5.0X100 (CATHETERS) ×3
DEVICE CLOSURE PERCLS PRGLD 6F (VASCULAR PRODUCTS) ×5 IMPLANT
DEVICE SAFEGUARD 24CM (GAUZE/BANDAGES/DRESSINGS) ×6 IMPLANT
DEVICE TORQUE (MISCELLANEOUS) ×3 IMPLANT
DRYSEAL FLEXSHEATH 18FR 33CM (SHEATH) ×2
EXCLUDER TNK LEG 31MX14X13 (Endovascular Graft) ×1 IMPLANT
EXCLUDER TRUNK LEG 31MX14X13 (Endovascular Graft) ×3 IMPLANT
GLIDEWIRE ANGLED SS 035X260CM (WIRE) ×3 IMPLANT
GLIDEWIRE STIFF .35X180X3 HYDR (WIRE) ×3 IMPLANT
LEG CONTRALATERAL 16X20X13.5 (Vascular Products) ×2 IMPLANT
LEG CONTRALATERAL 16X20X9.5 (Endovascular Graft) ×2 IMPLANT
PACK ANGIOGRAPHY (CUSTOM PROCEDURE TRAY) ×3 IMPLANT
PAD GROUND ADULT SPLIT (MISCELLANEOUS) ×3 IMPLANT
PERCLOSE PROGLIDE 6F (VASCULAR PRODUCTS) ×15
SHEATH BRITE TIP 6FRX11 (SHEATH) ×6 IMPLANT
SHEATH BRITE TIP 8FRX11 (SHEATH) ×6 IMPLANT
SHEATH DRYSEAL FLEX 18FR 33CM (SHEATH) ×1 IMPLANT
SHEATH DRYSEAL GORE 12FRX28 (SHEATH) ×3 IMPLANT
SPONGE XRAY 4X4 16PLY STRL (MISCELLANEOUS) ×9 IMPLANT
STENT GRAFT CONTRALAT 16X20X9. (Endovascular Graft) ×1 IMPLANT
STENT GRAFT CONTRALAT 20X13.5 (Vascular Products) ×1 IMPLANT
TOWEL OR 17X26 4PK STRL BLUE (TOWEL DISPOSABLE) ×6 IMPLANT
WIRE AMPLATZ SSTIFF .035X260CM (WIRE) ×6 IMPLANT
WIRE J 3MM .035X145CM (WIRE) ×6 IMPLANT

## 2015-03-12 NOTE — Transfer of Care (Signed)
Immediate Anesthesia Transfer of Care Note  Patient: Cameron Kerns Staron Sr.  Procedure(s) Performed: Procedure(s): Endovascular Repair/Stent Graft (N/A)  Patient Location: PACU  Anesthesia Type:General  Level of Consciousness: awake, alert , oriented and patient cooperative  Airway & Oxygen Therapy: Patient Spontanous Breathing and Patient connected to face mask oxygen  Post-op Assessment: Report given to RN and Post -op Vital signs reviewed and stable  Post vital signs: Reviewed and stable  Last Vitals:  Filed Vitals:   03/12/15 1010  BP: 187/99  Pulse:   Temp: 36.2 C  Resp: 16    Complications: No apparent anesthesia complications

## 2015-03-12 NOTE — Progress Notes (Signed)
At 1327 pt's had a vagal response and brady down to mid 30's HR. Pt stated he was nauseous and slightly diaphoretic. Pt began to trend back up into HR of 70's within 5 minutes. Called Dr. Lucky Cowboy and left message with the tech about event and stated there was not a PRN order for atropine should we need it. Kathi Simpers, RN

## 2015-03-12 NOTE — H&P (Signed)
[facility] Admission History & Physical  MRN : [id number]  Cameron KARI Sr. is a 74 y.o. (Sep 10, 1941) male who presents with chief complaint of No chief complaint on file. Marland Kitchen  History of Present Illness: 74 yo WM with asymptomatic 5.1 cm AAA.  Appropriate anatomy for stent graft.  No complaints today.  Current Facility-Administered Medications  Medication Dose Route Frequency Provider Last Rate Last Dose  . clindamycin (CLEOCIN) 300 MG/50ML IVPB           . clindamycin (CLEOCIN) IVPB 300 mg  300 mg Intravenous Once Algernon Huxley, MD      . lactated ringers infusion   Intravenous Continuous Lorane Gell, MD 50 mL/hr at 03/12/15 3710    . methylPREDNISolone sodium succinate (SOLU-MEDROL) 125 mg/2 mL injection 125 mg  125 mg Intravenous Once Algernon Huxley, MD        Past Medical History  Diagnosis Date  . Cancer     vocal cord Rt cancer  . Hypertension   . Coronary artery disease   . Myocardial infarction   . Aneurysm   . 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 Atrial fibrillation     Past Surgical History  Procedure Laterality Date  . Coronary artery bypass graft    . Skin cancer removed    . Vocal cord tumor removed Right   . Trans anal ca removed      Social History History  Substance Use Topics  . Smoking status: Former Research scientist (life sciences)  . Smokeless tobacco: Not on file  . Alcohol Use: Not on file    Family History History reviewed. No pertinent family history.no history of bleeding or clotting disorders or autoimmune diseases  Allergies  Allergen Reactions  . Codeine Anaphylaxis  . Penicillins Anaphylaxis  . Fentanyl Nausea And Vomiting  . Shellfish Allergy Nausea And Vomiting     REVIEW OF SYSTEMS (Negative unless checked)  Constitutional: [] Weight loss  [] Fever  [] Chills Cardiac: [] Chest pain   [] Chest pressure   [] Palpitations   [] Shortness of breath when laying flat   [] Shortness of breath at rest   [x] Shortness of breath with  exertion. Vascular:  [] Pain in legs with walking   [] Pain in legs at rest   [] Pain in legs when laying flat   [] Claudication   [] Pain in feet when walking  [] Pain in feet at rest  [] Pain in feet when laying flat   [] History of DVT   [] Phlebitis   [] Swelling in legs   [] Varicose veins   [] Non-healing ulcers Pulmonary:   [] Uses home oxygen   [] Productive cough   [] Hemoptysis   [] Wheeze  [] COPD   [] Asthma Neurologic:  [] Dizziness  [] Blackouts   [] Seizures   [] History of stroke   [] History of TIA  [] Aphasia   [] Temporary blindness   [] Dysphagia   [] Weakness or numbness in arms   [] Weakness or numbness in legs Musculoskeletal:  [] Arthritis   [] Joint swelling   [] Joint pain   [] Low back pain Hematologic:  [] Easy bruising  [] Easy bleeding   [] Hypercoagulable state   [] Anemic  [] Hepatitis Gastrointestinal:  [] Blood in stool   [] Vomiting blood  [] Gastroesophageal reflux/heartburn   [] Difficulty swallowing. Genitourinary:  [] Chronic kidney disease   [] Difficult urination  [] Frequent urination  [] Burning with urination   [] Blood in urine Skin:  [] Rashes   [] Ulcers   [] Wounds Psychological:  [] History of anxiety   []  History of major depression.  Physical Examination  Filed Vitals:   03/12/15 0655  BP: 153/89  Pulse: 58  Temp: 98.7 F (37.1 C)  TempSrc: Oral  SpO2: 96%   There is no weight on file to calculate BMI.  Head: [place]/AT, No temporalis wasting. Prominent temp pulse not noted. Ear/Nose/Throat: Hearing grossly intact, nares w/o erythema or drainage, oropharynx w/o Erythema/Exudate, Eyes: PERRLA, EOMI.  Neck: Supple, no nuchal rigidity.  No bruit or JVD.  Pulmonary:  Good air movement, clear to auscultation bilaterally, no use of accessory muscles.  Cardiac: RRR, normal S1, S2, no Murmurs, rubs or gallops. Vascular:  Vessel Right Left  Radial Palpable Palpable  Ulnar Palpable Palpable  Brachial Palpable Palpable  Carotid Palpable, without bruit Palpable, without bruit  Aorta Not palpable  N/A  Femoral Palpable Palpable  Popliteal Palpable Palpable  PT Palpable Palpable  DP Palpable Not Palpable   Gastrointestinal: soft, non-tender/non-distended. No guarding/reflex. Increased aortic impulse Musculoskeletal: M/S 5/5 throughout.  Extremities without ischemic changes.  No deformity or atrophy.  Neurologic: CN 2-12 intact. Pain and light touch intact in extremities.  Symmetrical.  Speech is fluent. Motor exam as listed above. Psychiatric: Judgment intact, Mood & affect appropriate for pt's clinical situation. Dermatologic: No rashes or ulcers noted.  No cellulitis or open wounds. Lymph : No Cervical, Axillary, or Inguinal lymphadenopathy.      CBC Lab Results  Component Value Date   WBC 7.1 03/03/2015   HGB 13.3 03/03/2015   HCT 40.2 03/03/2015   MCV 86.6 03/03/2015   PLT 204 03/03/2015    BMET    Component Value Date/Time   NA 139 03/03/2015 1029   NA 143 12/17/2011 0215   K 4.2 03/03/2015 1029   K 4.1 12/17/2011 0215   CL 110 03/03/2015 1029   CL 108* 12/17/2011 0215   CO2 26 03/03/2015 1029   CO2 25 12/17/2011 0215   GLUCOSE 99 03/03/2015 1029   GLUCOSE 95 12/17/2011 0215   BUN 13 03/03/2015 1029   BUN 16 12/17/2011 0215   CREATININE 0.92 03/03/2015 1029   CREATININE 0.94 12/17/2011 0215   CALCIUM 8.5* 03/03/2015 1029   CALCIUM 8.3* 12/17/2011 0215   GFRNONAA >60 03/03/2015 1029   GFRAA >60 03/03/2015 1029   Estimated Creatinine Clearance: 85.7 mL/min (by C-G formula based on Cr of 0.92).  COAG Lab Results  Component Value Date   INR 1.05 03/03/2015    Radiology 5.1 cm AAA  Assessment/Plan 1. AAA: for endovascular repair today.  Risks and benefits discussed 2. CAS: asymptomatic.  Following as an outpatient 3. CAD: has seen cardiology preop.  On appropriate medical management   Dafney Farler, MD  03/12/2015 7:30 AM

## 2015-03-12 NOTE — Progress Notes (Signed)
Patient resting quietly in CCU BP around 150/80 HR around 70. Still a little sleepy, but waking up Feet warm, no bleeding from sites Doing well Watch in ccu overnight Advance diet as tolerated

## 2015-03-12 NOTE — Op Note (Signed)
OPERATIVE NOTE   PROCEDURE: 1. US guidance for vascular access, bilateral femoral arteries 2. Catheter placement into aorta from bilateral femoral approaches 3. Placement of a 28mm diameter short Gore Excluder Endoprosthesis main body device right with a 20 mm diameter x 13 cm length contralateral limb 4. Right iliac extension with 20 mm diameter by 10 cm length iliac limb 5. ProGlide closure devices bilateral femoral arteries  PRE-OPERATIVE DIAGNOSIS: AAA  POST-OPERATIVE DIAGNOSIS: same  SURGEON: Leotis Pain, MD and Hortencia Pilar, MD - Co-surgeons  ANESTHESIA: general  ESTIMATED BLOOD LOSS: 50 cc  FINDING(S): 1.  AAA  SPECIMEN(S):  none  INDICATIONS:   Cameron Kerns [place]ingleton Sr. is a 74 y.o. male who presents with an approximately 5.1 cm infrarenal abdominal aortic aneurysm. He has acceptable anatomy for endovascular repair. Risks and benefits were discussed and he is agreeable to proceed.  DESCRIPTION: After obtaining full informed written consent, the patient was brought back to the operating room and placed supine upon the operating table.  The patient received IV antibiotics prior to induction.  After obtaining adequate anesthesia, the patient was prepped and draped in the standard fashion for endovascular AAA repair.  We then began by gaining access to both femoral arteries with US guidance with me working on the right and Dr. Delana Meyer working on the left.  The femoral arteries were found to be patent and accessed without difficulty with a needle under ultrasound guidance without difficulty on each side and permanent images were recorded.  We then placed 2 proglide devices on each side in a pre-close fashion and placed 8 French sheaths. The patient was then given  5000 units of intravenous heparin. The Pigtail catheter was placed into the aorta from the  right side. Using this image, we selected a 31 mm short Main body device.  Over a stiff wire, an 63 French sheath was placed. The  main body was then placed through the 18 French sheath. A Kumpe catheter was placed up the left side and a magnified image at the renal arteries was performed. The main body was then deployed just below the lowest renal artery which was an accessory renal artery on the left. The Kumpe catheter was used to cannulate the contralateral gate without difficulty and successful cannulation was confirmed by twirling the pigtail catheter in the main body. We then placed a stiff wire and a retrograde arteriogram was performed through the left femoral sheath. We upsized to the 12 [place] sheath for the contralateral limb and a 20 mm diameter by 13.5 cm length limb was selected and deployed. The main body deployment was then completed. Based off the angiographic findings, extension limbs were necessary on the right side.  Through the 62 French sheath a 20 mm diameter by 10 cm length right iliac extension limb was delivered and deployed just above the right hypogastric artery. Retrograde angiogram from the right sheath located the right hypogastric artery . All junction points and seals zones were treated with the compliant balloon. The pigtail catheter was then replaced and a completion angiogram was performed.   A small type II Endoleak was detected on completion angiography but no type I or 3 endoleaks were identified. The renal arteries were found to be widely patent. Both hypogastric arteries were found to be widely patent . At this point we elected to terminate the procedure. We secured the pro glide devices for hemostasis on the femoral arteries. The skin incision was closed with a 4-0 Monocryl. Dermabond and  pressure dressing were placed. The patient was taken to the recovery room in stable condition having tolerated the procedure well.  COMPLICATIONS: none  CONDITION: stable  DEW,JASON  03/12/2015, 9:51 AM

## 2015-03-12 NOTE — Op Note (Signed)
    OPERATIVE NOTE   PROCEDURE: 1. US guidance for vascular access, bilateral femoral arteries 2. Catheter placement into aorta from bilateral femoral approaches 3. Placement of a C3 Gore Excluder Endoprosthesis main body 31 x 14 x 13 with a PLC 20 x 14 contralateral limb 4. Placement of a right sided iliac extender PLC 20 x 10 5. ProGlide closure devices bilateral femoral arteries  PRE-OPERATIVE DIAGNOSIS: AAA  POST-OPERATIVE DIAGNOSIS: same  SURGEON: Cameron Pain, MD and Cameron Pilar, MD - Co-surgeons  ANESTHESIA: general  ESTIMATED BLOOD LOSS: 50 cc  FINDING(S): 1.  AAA  SPECIMEN(S):  none  INDICATIONS:   Cameron Kerns Rann Sr. is a 74 y.o. male who presents with abdominal aortic aneurysm greater than 5 cm in diameter and therefore he is undergoing abdominal aortic aneurysm repair using an endograft to prevent lethal rupture.  DESCRIPTION: After obtaining full informed written consent, the patient was brought back to the operating room and placed supine upon the operating table.  The patient received IV antibiotics prior to induction.  After obtaining adequate anesthesia, the patient was prepped and draped in the standard fashion for endovascular AAA repair.  We then began by gaining access to both femoral arteries with US guidance with me working on the left and Cameron Parrish working on the right.  The femoral arteries were found to be patent and accessed without difficulty with a needle under ultrasound guidance without difficulty on each side and permanent images were recorded.  We then placed 2 proglide devices on each side in a pre-close fashion and placed 8 French sheaths. The patient was then given  5000 units of intravenous heparin. The Pigtail catheter was placed into the aorta from the  right side. Using this image, we selected a 31 x 14 x 13 Main body device.  Over a stiff wire, an 76 French sheath was placed. The main body was then placed through the 18 French sheath. A Kumpe  catheter was placed up the left side and a magnified image at the renal arteries was performed. The main body was then deployed just below the lowest renal artery. The Kumpe catheter was used to cannulate the contralateral gate without difficulty and successful cannulation was confirmed by twirling the pigtail catheter in the main body. We then placed a stiff wire and a retrograde arteriogram was performed through the left femoral sheath. We upsized to the 12 [place] sheath for the contralateral limb and a 20 x 14 limb was selected and deployed. The main body deployment was then completed. Based off the angiographic findings, extension limbs were necessary on the right.  Therefore a 20 x 10 limb was advanced up the right side and positioned just above the iliac bifurcation. The 20 x 10 limb was then deployed without difficulty. . All junction points and seals zones were treated with the compliant balloon. The pigtail catheter was then replaced and a completion angiogram was performed.   Type II Endoleak was detected on completion angiography. The renal arteries were found to be widely patent.  At this point we elected to terminate the procedure. We secured the pro glide devices for hemostasis on the femoral arteries. The skin incision was closed with a 4-0 Monocryl. Dermabond and pressure dressing were placed. The patient was taken to the recovery room in stable condition having tolerated the procedure well.  COMPLICATIONS: none  CONDITION: stable  Cameron Parrish  03/12/2015, 9:54 AM

## 2015-03-12 NOTE — OR Nursing (Signed)
fsbs 99 pre procedure order

## 2015-03-12 NOTE — Anesthesia Preprocedure Evaluation (Signed)
Anesthesia Evaluation  Patient identified by MRN, date of birth, ID band Patient awake    Reviewed: Allergy & Precautions, NPO status , Patient's Chart, lab work & pertinent test results  Airway Mallampati: I       Dental  (+) Upper Dentures, Lower Dentures   Pulmonary former smoker,  breath sounds clear to auscultation        Cardiovascular hypertension, Pt. on medications and Pt. on home beta blockers + CAD, + Past MI and + CABG Rhythm:Irregular Rate:Normal     Neuro/Psych    GI/Hepatic   Endo/Other    Renal/GU      Musculoskeletal   Abdominal (+) + obese,  Abdomen: soft.    Peds  Hematology   Anesthesia Other Findings   Reproductive/Obstetrics                             Anesthesia Physical Anesthesia Plan  ASA: IV  Anesthesia Plan: General   Post-op Pain Management:    Induction: Intravenous  Airway Management Planned: Oral ETT  Additional Equipment: Arterial line  Intra-op Plan:   Post-operative Plan: Extubation in OR  Informed Consent: I have reviewed the patients History and Physical, chart, labs and discussed the procedure including the risks, benefits and alternatives for the proposed anesthesia with the patient or authorized representative who has indicated his/her understanding and acceptance.     Plan Discussed with: CRNA  Anesthesia Plan Comments:         Anesthesia Quick Evaluation

## 2015-03-12 NOTE — Anesthesia Postprocedure Evaluation (Signed)
  Anesthesia Post-op Note  Patient: Cameron Kerns Hegg Sr.  Procedure(s) Performed: Procedure(s): Endovascular Repair/Stent Graft (N/A)  Anesthesia type:General  Patient location: PACU  Post pain: Pain level controlled  Post assessment: Post-op Vital signs reviewed, Patient's Cardiovascular Status Stable, Respiratory Function Stable, Patent Airway and No signs of Nausea or vomiting  Post vital signs: Reviewed and stable  Last Vitals:  Filed Vitals:   03/12/15 1109  BP: 151/79  Pulse: 60  Temp: 36.2 C  Resp: 19    Level of consciousness: awake, alert  and patient cooperative  Complications: No apparent anesthesia complications

## 2015-03-13 LAB — CBC
HCT: 37.2 % — ABNORMAL LOW (ref 40.0–52.0)
Hemoglobin: 12.3 g/dL — ABNORMAL LOW (ref 13.0–18.0)
MCH: 28.5 pg (ref 26.0–34.0)
MCHC: 33.2 g/dL (ref 32.0–36.0)
MCV: 86 fL (ref 80.0–100.0)
Platelets: 219 10*3/uL (ref 150–440)
RBC: 4.33 MIL/uL — AB (ref 4.40–5.90)
RDW: 13.7 % (ref 11.5–14.5)
WBC: 19.8 10*3/uL — ABNORMAL HIGH (ref 3.8–10.6)

## 2015-03-13 LAB — BASIC METABOLIC PANEL
Anion gap: 8 (ref 5–15)
BUN: 19 mg/dL (ref 6–20)
CALCIUM: 8.5 mg/dL — AB (ref 8.9–10.3)
CO2: 23 mmol/L (ref 22–32)
Chloride: 104 mmol/L (ref 101–111)
Creatinine, Ser: 0.92 mg/dL (ref 0.61–1.24)
GFR calc Af Amer: >60 mL/min (ref >60)
Glucose, Bld: 149 mg/dL — ABNORMAL HIGH (ref 65–99)
Potassium: 4.3 mmol/L (ref 3.5–5.1)
Sodium: 135 mmol/L (ref 135–145)

## 2015-03-13 MED ORDER — ASPIRIN 325 MG PO TABS: 325.0000 mg | ORAL_TABLET | Freq: Every day | ORAL | Status: DC

## 2015-03-13 MED ORDER — ZIPRASIDONE MESYLATE 20 MG IM SOLR
10.0000 mg | Freq: Once | INTRAMUSCULAR | Status: AC
  Administered 2015-03-13: 10 mg via INTRAMUSCULAR

## 2015-03-13 NOTE — Discharge Instructions (Signed)
No heavy lifting or driving for one week.   Resume all other activities as tolerated

## 2015-03-13 NOTE — Progress Notes (Signed)
Patient is alert and oriented to self. Reporting no pain. Foley catheter removed around 0715, patient has voided in urinal. Up ambulating around room with standby assist with no complaints. Bilateral groin incisions WNL with pulses +1 bilateral. Discharge instructions reviewed with wife and patient, no concerns or questions per wife and patient. No new prescriptions. Patient escorted out via wheelchair to home with wife.

## 2015-03-13 NOTE — Discharge Summary (Signed)
  [facility]    Discharge Summary    Patient ID:  Cameron Kerns Disanti Sr. MRN: [id number] DOB/AGE: 1940-12-21 74 y.o.  Admit date: 03/12/2015 Discharge date: 03/13/2015 Date of Surgery: 03/12/2015 Surgeon: Surgeon(s): Algernon Huxley, MD Katha Cabal, MD  Admission Diagnosis: AAA  Discharge Diagnoses:  AAA    Secondary Diagnoses: Past Medical History  Diagnosis Date  . Cancer     vocal cord Rt cancer  . Coronary artery disease   . Myocardial infarction   . Aneurysm   . 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 Atrial fibrillation     Procedure(s): Endovascular Repair/Stent Graft  Discharged Condition: good  HPI:  1 Patient with known AAA, has grown to over 5 cm in maximal diameter.  Brought in for elective repair  Hospital Course:  Cameron Aus Sr. is a 74 y.o. male is S/P Procedure(s): endovascular AAA repair Endovascular Repair/Stent Graft Extubated: POD # 0 Physical exam: abdomen soft, feet warm with pulses, access sites without hematoma Post-op wounds clean, dry, intact or healing well Pt. Ambulating, voiding and taking PO diet without difficulty. Pt pain controlled with PO pain meds. Labs as below Complications:none  Consults:     Significant Diagnostic Studies: CBC Lab Results  Component Value Date   WBC 19.8* 03/13/2015   HGB 12.3* 03/13/2015   HCT 37.2* 03/13/2015   MCV 86.0 03/13/2015   PLT 219 03/13/2015    BMET    Component Value Date/Time   NA 135 03/13/2015 0526   NA 143 12/17/2011 0215   K 4.3 03/13/2015 0526   K 4.1 12/17/2011 0215   CL 104 03/13/2015 0526   CL 108* 12/17/2011 0215   CO2 23 03/13/2015 0526   CO2 25 12/17/2011 0215   GLUCOSE 149* 03/13/2015 0526   GLUCOSE 95 12/17/2011 0215   BUN 19 03/13/2015 0526   BUN 16 12/17/2011 0215   CREATININE 0.92 03/13/2015 0526   CREATININE 0.94 12/17/2011 0215   CALCIUM 8.5* 03/13/2015 0526   CALCIUM 8.3* 12/17/2011 0215   GFRNONAA >60  03/13/2015 0526   GFRAA >60 03/13/2015 0526   COAG Lab Results  Component Value Date   INR 1.05 03/03/2015     Disposition:  Discharge to :Home    Medication List    ASK your doctor about these medications        aspirin EC 325 MG tablet  Take 325 mg by mouth daily.     metoprolol tartrate 25 MG tablet  Commonly known as:  LOPRESSOR  Take 25 mg by mouth 2 (two) times daily.     RA VITAMIN B-12 TR 1000 MCG Tbcr  Generic drug:  Cyanocobalamin  Take 1 tablet by mouth daily.       Verbal and written Discharge instructions given to the patient. Wound care per Discharge AVS   Signed: Keymiah Lyles, MD  03/13/2015, 9:23 AM

## 2015-03-13 NOTE — Progress Notes (Signed)
Pt started to become increasingly confused and agitated around 2345. Eventually the Pt became combative towards staff and a Code 300 was called. The Pt was trying to remove all of his lines, so in the Pt's best interest nursing staff removed his right radial arterial line before future harm could be done. After a phone call to the Pt's wife the Pt allowed staff to situate him back in the bed. From here the Pt refused to wear pulse oximeter, SCD's, and BP Cuff. Both of his groin access sites are still a level 0. Pt mentioned that he had a headache, but refused to describe the pain or receive any form of treatment for the pain.The Pt is now resting quietly. Will continue to monitor.

## 2015-03-14 LAB — GLUCOSE, CAPILLARY
GLUCOSE-CAPILLARY: 179 mg/dL — AB (ref 65–99)
GLUCOSE-CAPILLARY: 187 mg/dL — AB (ref 65–99)

## 2015-03-18 ENCOUNTER — Encounter: Payer: Self-pay | Admitting: Vascular Surgery

## 2016-02-25 ENCOUNTER — Encounter: Payer: Self-pay | Admitting: *Deleted

## 2016-02-27 ENCOUNTER — Ambulatory Visit
Admission: RE | Admit: 2016-02-27 | Discharge: 2016-02-27 | Disposition: A | Discharge status: Home / Self Care | Payer: Medicare Other | Source: Ambulatory Visit | Attending: Radiation Oncology | Admitting: Radiation Oncology

## 2016-02-27 ENCOUNTER — Encounter: Payer: Self-pay | Admitting: Radiation Oncology

## 2016-02-27 VITALS — BP 132/79 | HR 81 | Temp 98.6°F | Resp 20 | Wt 229.2 lb

## 2016-02-27 DIAGNOSIS — Z923 Personal history of irradiation: Secondary | ICD-10-CM | POA: Insufficient documentation

## 2016-02-27 DIAGNOSIS — C329 Malignant neoplasm of larynx, unspecified: Secondary | ICD-10-CM

## 2016-02-27 DIAGNOSIS — Z8521 Personal history of malignant neoplasm of larynx: Secondary | ICD-10-CM | POA: Diagnosis present

## 2016-02-27 NOTE — Progress Notes (Signed)
Radiation Oncology Follow up Note  Name: Cameron QUICKLE Sr.   Date:   02/27/2016 MRN:  [id number] DOB: 15-Mar-1941    This 75 y.o. male presents to the clinic today for 2 year follow-up status post ration therapy for stage I laryngeal carcinoma.  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 Seen today in routine follow-up he is doing well. Continues close follow-up care with ENT who is noted no evidence of disease. Voice quality is good he specifically denies head and neck pain or dysphagia..  COMPLICATIONS OF TREATMENT: none  FOLLOW UP COMPLIANCE: keeps appointments   PHYSICAL EXAM:  BP 132/79 mmHg  Pulse 81  Temp(Src) 98.6 F (37 C)  Resp 20  Wt 229 lb 2.7 oz (103.95 kg) Oral cavity is clear no oral mucosal lesions are noted. Indirect mirror examination shows upper airway clear vallecula and base of tongue within normal limits. Cords are approximating well. I see no evidence of disease. Neck is clear without evidence of subject gastric cervical or supraclavicular adenopathy. Well-developed well-nourished patient in NAD. HEENT reveals PERLA, EOMI, discs not visualized.  Oral cavity is clear. No oral mucosal lesions are identified. Neck is clear without evidence of cervical or supraclavicular adenopathy. Lungs are clear to A&P. Cardiac examination is essentially unremarkable with regular rate and rhythm without murmur rub or thrill. Abdomen is benign with no organomegaly or masses noted. Motor sensory and DTR levels are equal and symmetric in the upper and lower extremities. Cranial nerves II through XII are grossly intact. Proprioception is intact. No peripheral adenopathy or edema is identified. No motor or sensory levels are noted. Crude visual fields are within normal range.  RADIOLOGY RESULTS: No current films for review  PLAN:  At the present time he is doing well with no evidence of disease. He continues every three-month follow-up with ENT. He is close to 3 years out I'm going to stop and discontinue follow-up care. Would be happy to reevaluate him at any time should ration oncology opinion be needed. He continues again close follow-up care with ENT. Patient knows to call with any concerns.  I would like to take this opportunity to thank you for allowing me to participate in the care of your patient.Armstead Peaks., MD

## 2016-03-04 ENCOUNTER — Ambulatory Visit: Payer: Self-pay | Admitting: General Surgery

## 2016-06-09 ENCOUNTER — Encounter: Payer: Self-pay | Admitting: *Deleted

## 2016-08-11 ENCOUNTER — Ambulatory Visit: Payer: Self-pay | Admitting: General Surgery

## 2016-11-15 IMAGING — CT CT CTA ABD/PEL W/CM AND/OR W/O CM
2 of 7 series · 15 of 46 positions shown, 17 images · IV contrast (APPLIED)
Comparison: 03/10/2010

CLINICAL DATA: Abdominal aortic aneurysm

EXAM:
CT ANGIOGRAPHY ABDOMEN AND PELVIS
TECHNIQUE: Multidetector CT imaging of the abdomen and pelvis was performed
using the standard protocol during bolus administration of
intravenous contrast. Multiplanar reconstructed images including
MIPs were obtained and reviewed to evaluate the vascular anatomy.
CONTRAST:  100 cc Omnipaque 350

[Series 4: arterial · axial · arterial · 0.81mm/px · z∈[-458,+12]mm · 12 of 261 slices shown, 14 images]
[im 13/261  soft-tissue]
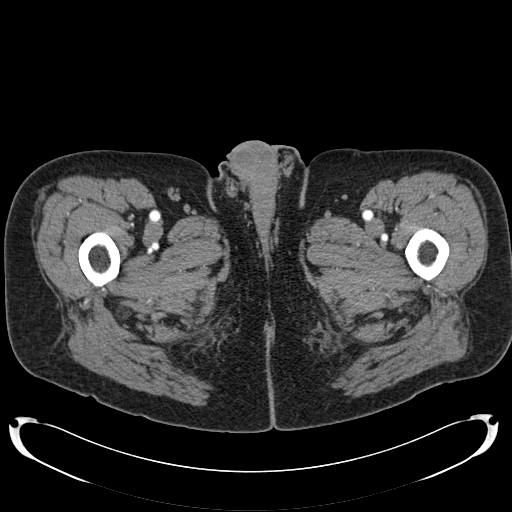
[im 13/261  bone]
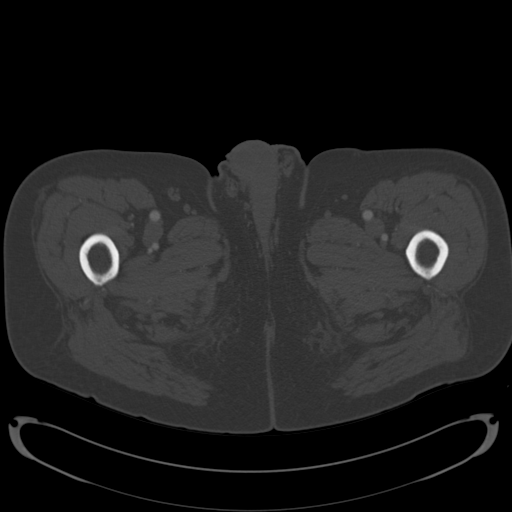
[im 38/261  soft-tissue]
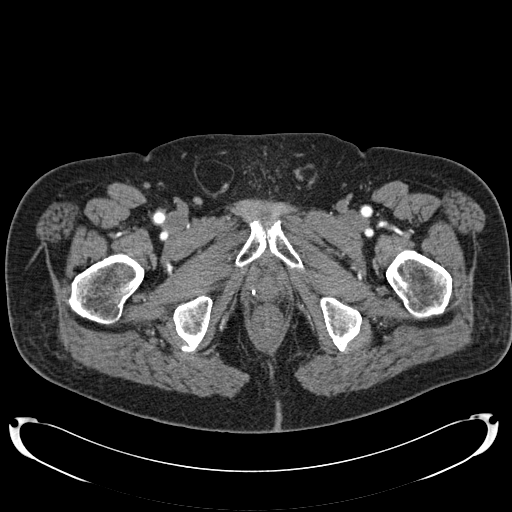
[im 62/261  soft-tissue]
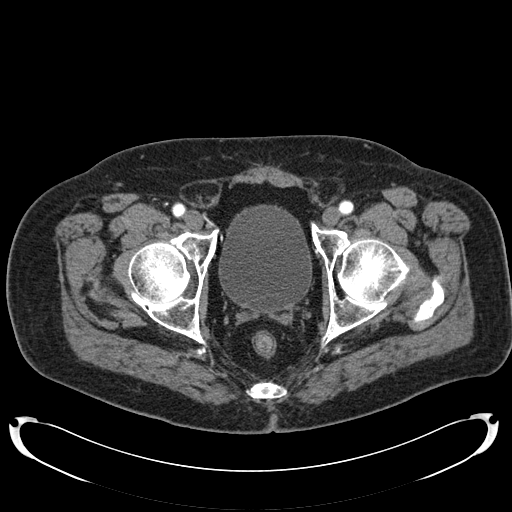
[im 75/261  soft-tissue]
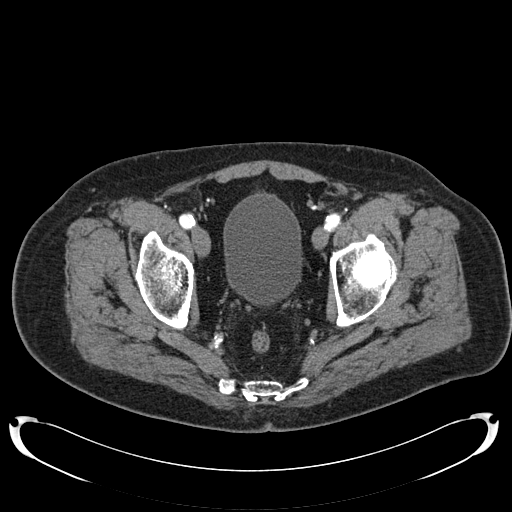
[im 100/261  soft-tissue]
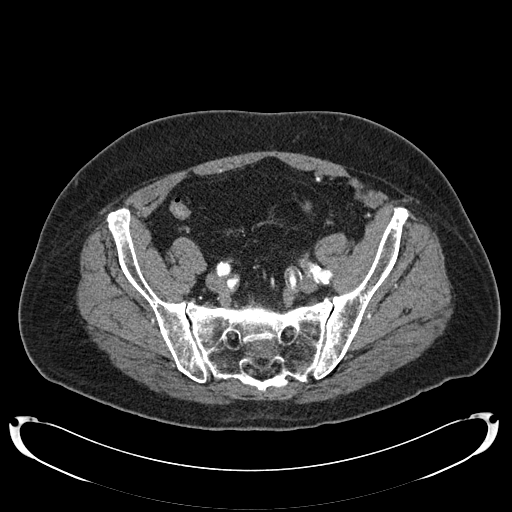
[im 124/261  soft-tissue]
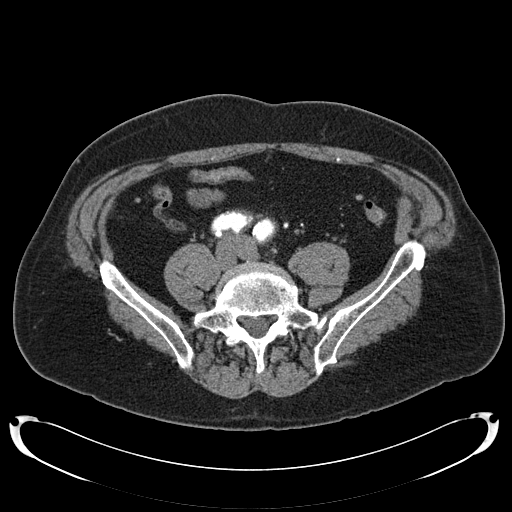
[im 137/261  soft-tissue]
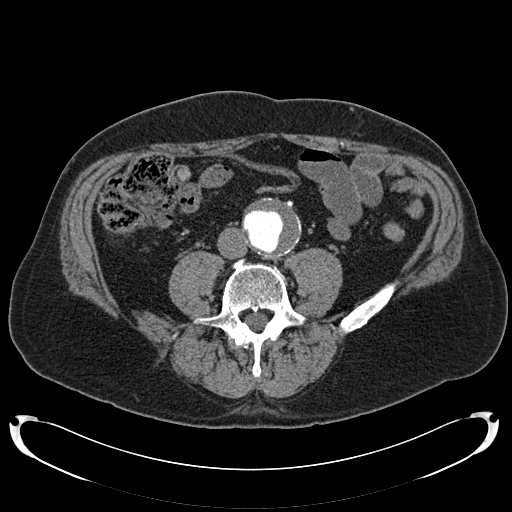
[im 161/261  soft-tissue]
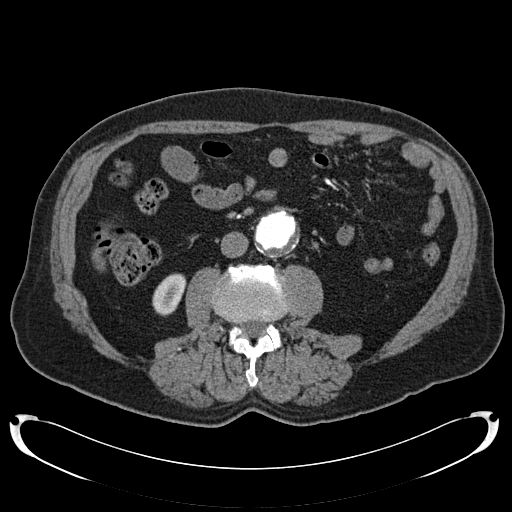
[im 186/261  soft-tissue]
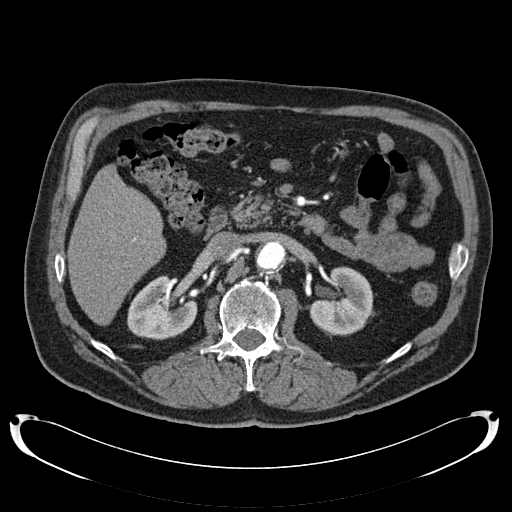
[im 186/261  bone]
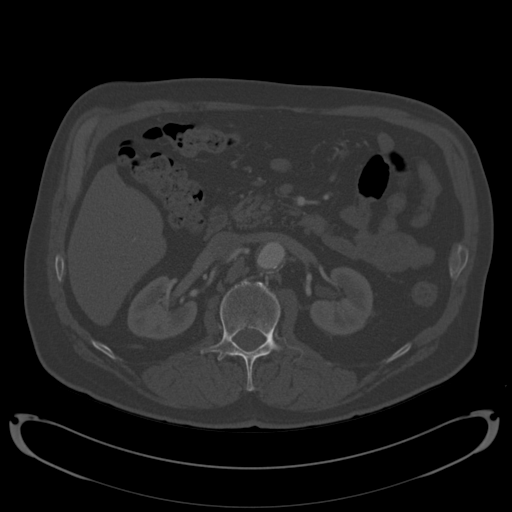
[im 199/261  soft-tissue]
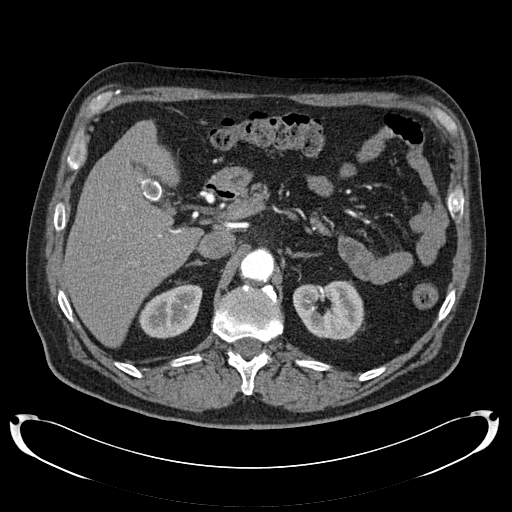
[im 223/261  soft-tissue]
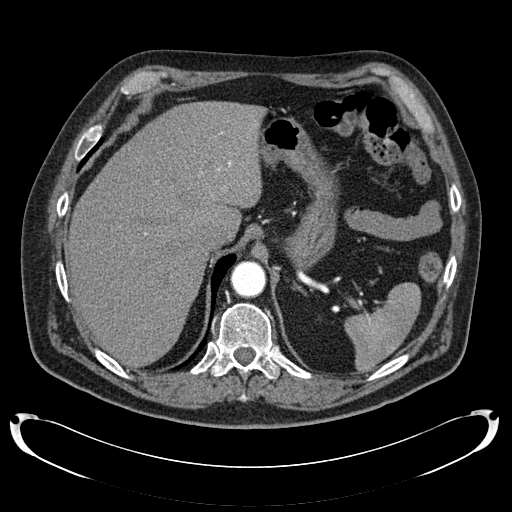
[im 248/261  soft-tissue]
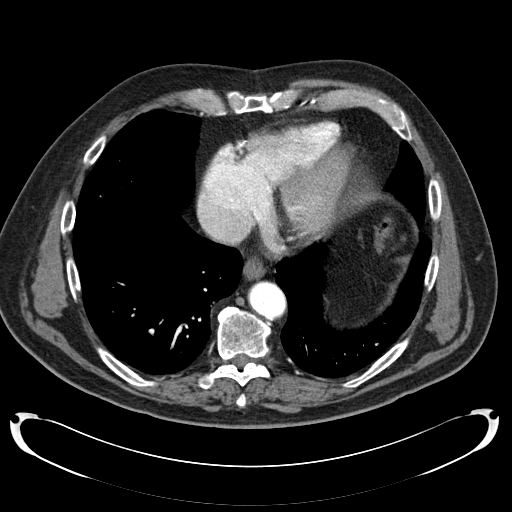

[Series 8: cor arterial mpr · coronal · arterial · 0.78mm/px · 3 of 145 slices shown]
[im 37/145  soft-tissue]
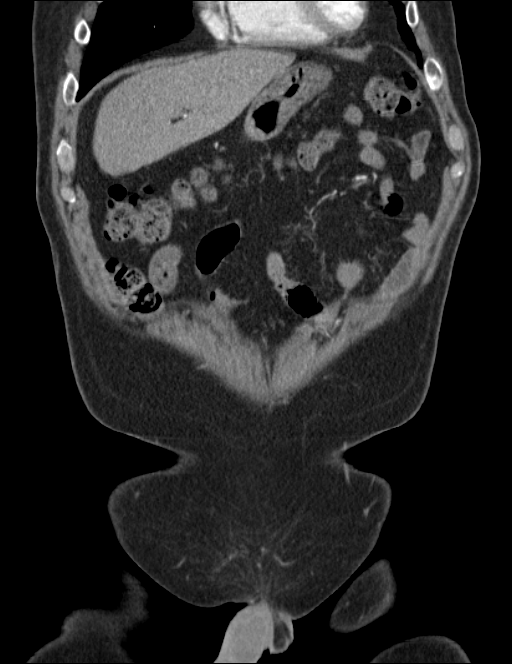
[im 73/145  soft-tissue]
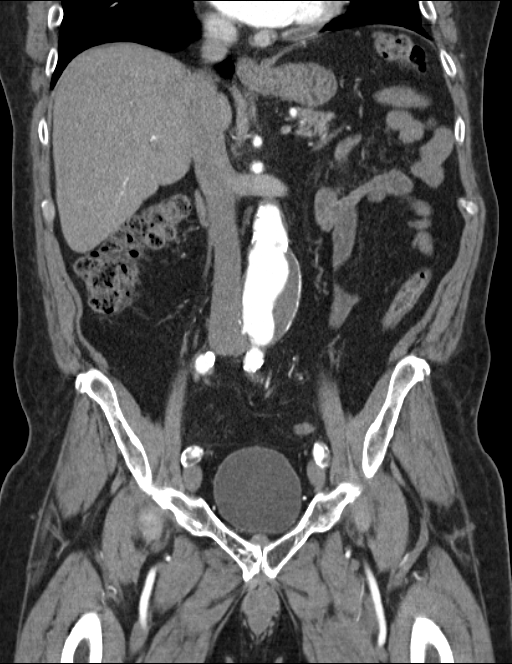
[im 109/145  soft-tissue]
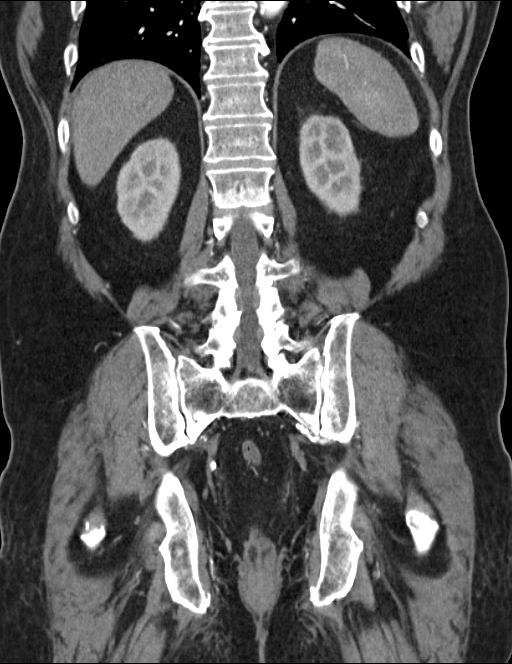

[15 of 46 positions shown; findings below may reference images not displayed]

FINDINGS: Arterial findings:

Aorta: Diffuse calcific atherosclerosis with wall thickening and
mural thrombus. Fusiform atherosclerotic infrarenal abdominal aortic
aneurysm evident involving the iliac bifurcation. At the level of
the IMA, the aneurysm measures 5.1 x 5.0 cm, previously 4.2 x
cm.

Celiac axis: Atherosclerotic origin remaining patent. Splenic, left
gastric and hepatic branches all remain patent.

Superior mesenteric: Atherosclerotic patent origin. There is a
replaced right hepatic artery off of the proximal SMA. Main SMA
trunk is patent.

Left renal: Main left renal artery has atherosclerotic origin
remains patent. There is an accessory left renal artery to the lower
pole.

Right renal: Atherosclerotic but patent origin. No accessory right
renal vasculature demonstrated.

Inferior mesenteric: Patent IMA off of the aneurysm sac anteriorly,
image 118.

Left iliac: Aneurysm extends to involve the bifurcation. Left iliac
system is tortuous and atherosclerotic. The left common, internal
and external iliac arteries remain patent.

Right iliac: Aneurysmal bifurcation. Tortuous and atherosclerotic
right iliac system. Right common, internal and external iliac
arteries remain patent without occlusion. Mild luminal narrowing of
the right external iliac artery, image 179 estimated at less than
50%.

Femorals: Visualized common femoral, proximal profunda femoral, and
proximal superficial femoral arteries demonstrate atherosclerosis
but no chronic occlusion.

Venous findings: IVC, renal, hepatic, portal and splenic veins all
remain patent.

Review of the MIP images confirms the above findings.

Nonvascular findings: Lower chest: Minor dependent bibasilar
atelectasis. Prior median sternotomy noted. Mild cardiomegaly
evident. No pericardial or pleural effusion. Thoracic osteophytes on
the right.

Abdomen: Calcified gallstones noted within a collapsed gallbladder.
No biliary dilatation. Liver, biliary system, pancreas, spleen, and
adrenal glands are within normal limits for age and demonstrate no
acute process.

Kidneys demonstrate hypodense cortical cyst in the left kidney upper
pole measuring 10 mm. No renal obstruction or hydronephrosis.

Negative for bowel obstruction, dilatation, ileus, or free air.

No abdominal free fluid, fluid collection, hemorrhage, abscess, or
adenopathy.

Pelvis: Normal appendix demonstrated. Moderately stool-filled right
colon and cecum. Distal colon is collapsed. Urinary bladder
unremarkable. No pelvic free fluid, fluid collection, hemorrhage,
abscess, or adenopathy. Fat containing inguinal hernias noted
bilaterally.

Bones appear osteopenic. Degenerative changes diffusely of the
spine, SI joints and hips.
IMPRESSION: 5.1 x 5.0 cm infrarenal atherosclerotic aneurysm involving the iliac
bifurcation with slight enlargement compared to 03/10/2010.

Cholelithiasis

Other chronic findings as above

## 2017-08-10 ENCOUNTER — Ambulatory Visit: Payer: Self-pay | Admitting: General Surgery

## 2017-09-01 ENCOUNTER — Encounter: Payer: Self-pay | Admitting: *Deleted

## 2019-12-03 ENCOUNTER — Other Ambulatory Visit
Admission: RE | Admit: 2019-12-03 | Discharge: 2019-12-03 | Disposition: A | Discharge status: Home / Self Care | Payer: Medicare Other | Source: Ambulatory Visit | Attending: General Surgery | Admitting: General Surgery

## 2019-12-03 ENCOUNTER — Other Ambulatory Visit: Payer: Self-pay | Admitting: General Surgery

## 2019-12-03 DIAGNOSIS — K851 Biliary acute pancreatitis without necrosis or infection: Secondary | ICD-10-CM | POA: Insufficient documentation

## 2019-12-03 LAB — CBC WITH DIFFERENTIAL/PLATELET
Abs Immature Granulocytes: 0.05 10*3/uL (ref 0.00–0.07)
Basophils Absolute: 0.1 10*3/uL (ref 0.0–0.1)
Basophils Relative: 1 %
Eosinophils Absolute: 0.3 10*3/uL (ref 0.0–0.5)
Eosinophils Relative: 3 %
HCT: 37.9 % — ABNORMAL LOW (ref 39.0–52.0)
Hemoglobin: 12.1 g/dL — ABNORMAL LOW (ref 13.0–17.0)
Immature Granulocytes: 1 %
Lymphocytes Relative: 21 %
Lymphs Abs: 2 10*3/uL (ref 0.7–4.0)
MCH: 28.1 pg (ref 26.0–34.0)
MCHC: 31.9 g/dL (ref 30.0–36.0)
MCV: 87.9 fL (ref 80.0–100.0)
Monocytes Absolute: 1.2 10*3/uL — ABNORMAL HIGH (ref 0.1–1.0)
Monocytes Relative: 13 %
Neutro Abs: 5.8 10*3/uL (ref 1.7–7.7)
Neutrophils Relative %: 61 %
Platelets: 356 10*3/uL (ref 150–400)
RBC: 4.31 MIL/uL (ref 4.22–5.81)
RDW: 12.2 % (ref 11.5–15.5)
WBC: 9.4 10*3/uL (ref 4.0–10.5)
nRBC: 0 % (ref 0.0–0.2)

## 2019-12-03 LAB — COMPREHENSIVE METABOLIC PANEL
ALT: 55 U/L — ABNORMAL HIGH (ref 0–44)
AST: 30 U/L (ref 15–41)
Albumin: 3.3 g/dL — ABNORMAL LOW (ref 3.5–5.0)
Alkaline Phosphatase: 318 U/L — ABNORMAL HIGH (ref 38–126)
Anion gap: 10 (ref 5–15)
BUN: 13 mg/dL (ref 8–23)
CO2: 27 mmol/L (ref 22–32)
Calcium: 8.5 mg/dL — ABNORMAL LOW (ref 8.9–10.3)
Chloride: 98 mmol/L (ref 98–111)
Creatinine, Ser: 1.01 mg/dL (ref 0.61–1.24)
GFR calc Af Amer: >60 mL/min (ref >60)
GFR calc non Af Amer: >60 mL/min (ref >60)
Glucose, Bld: 102 mg/dL — ABNORMAL HIGH (ref 70–99)
Potassium: 3.7 mmol/L (ref 3.5–5.1)
Sodium: 135 mmol/L (ref 135–145)
Total Bilirubin: 1 mg/dL (ref 0.3–1.2)
Total Protein: 7.2 g/dL (ref 6.5–8.1)

## 2019-12-03 LAB — LIPASE, BLOOD: Lipase: 67 U/L — ABNORMAL HIGH (ref 11–51)

## 2019-12-04 ENCOUNTER — Other Ambulatory Visit: Payer: Self-pay | Admitting: General Surgery

## 2019-12-11 ENCOUNTER — Other Ambulatory Visit: Payer: Self-pay

## 2019-12-11 ENCOUNTER — Other Ambulatory Visit
Admission: RE | Admit: 2019-12-11 | Discharge: 2019-12-11 | Disposition: A | Discharge status: Home / Self Care | Payer: Medicare Other | Source: Ambulatory Visit | Attending: General Surgery | Admitting: General Surgery

## 2019-12-11 DIAGNOSIS — Z01818 Encounter for other preprocedural examination: Secondary | ICD-10-CM | POA: Insufficient documentation

## 2019-12-11 NOTE — Pre-Procedure Instructions (Addendum)
Pre-Admit Testing Provider Notification Note  Provider Notified: Dr. Ubaldo Glassing  Notification Mode: Fax  Reason: Clearance Request  Response: Fax confirmation received. Placed on chart. Noted on Pre-Admit Worksheet.  Additional Information: Communicated with both Dr. Bary Castilla and Dr. Ubaldo Glassing prior to sending Request for Clearance:      Signed: Beulah Gandy, RN

## 2019-12-11 NOTE — Patient Instructions (Addendum)
Your procedure is scheduled on: Monday 12/17/19.  Report to DAY SURGERY DEPARTMENT LOCATED ON 2ND FLOOR MEDICAL MALL ENTRANCE. To find out your arrival time please call [phone] between 1PM - 3PM on  Friday 12/14/19.   Remember: Instructions that are not followed completely may result in serious medical risk, up to and including death, or upon the discretion of your surgeon and anesthesiologist your surgery may need to be rescheduled.      _X__ 1. Do not eat food after midnight the night before your procedure.                 No gum chewing or hard candies. You may drink clear liquids up to 2 hours                 before you are scheduled to arrive for your surgery- DO NOT drink clear                 liquids within 2 hours of the start of your surgery.                 Clear Liquids include:  water, apple juice without pulp, clear carbohydrate                 drink such as Clearfast or Gatorade, Black Coffee or Tea (Do not add                 anything to coffee or tea).    __X__2.  On the morning of surgery brush your teeth with toothpaste and water, you may rinse your mouth with mouthwash if you wish.  Do not swallow any toothpaste or mouthwash.    __X__3.  Notify your doctor if there is any change in your medical condition      (cold, fever, infections).       Do not wear jewelry, make-up, hairpins, clips or nail polish. Do not wear lotions, powders, or perfumes.  Do not shave 48 hours prior to surgery. Men may shave face and neck. Do not bring valuables to the hospital.      [facility] is not responsible for any belongings or valuables.    Contacts, dentures/partials or body piercings may not be worn into surgery. Bring a case for your contacts, glasses or hearing aids, a denture cup will be supplied.   Patients discharged the day of surgery will not be allowed to drive home.     __X__ Take these medicines the morning of surgery with A SIP OF WATER:     1.  metoprolol tartrate (LOPRESSOR)      __X__ Use CHG Soap as directed   __X__ You were instructed to stop taking Aspirin 325 mg and start taking Aspirin 81 mg.     __X__ Stop Anti-inflammatories 7 days before surgery such as Advil, Ibuprofen, Motrin, BC or Goodies Powder, Naprosyn, Naproxen, Aleve, Meloxicam. May take Tylenol if needed for pain or discomfort.    __X__  Don't start taking any new herbal supplements or vitamins prior to your procedure.

## 2019-12-11 NOTE — Pre-Procedure Instructions (Signed)
CARDIOLOGY CLEARANCE   Awaiting official clearance form. Will place on chart upon receipt.

## 2019-12-11 NOTE — Pre-Procedure Instructions (Addendum)
EKG & ECHO RESULTS FROM [facility]    Echocardiogram W Colorflow Spectral Doppler With Contrast3/17/2021 [facility] Result Narrative  Patient Info Name:   Cameron Parrish Age:   79 years DOB:   1941/03/23 Gender:   Male MRN:   [id number] Accession #:   [id number] UN Ht:   183 cm Wt:   97 kg BSA:   2.24 m2 HR:   83 bpm BP:   170 /   79 mmHg Technical Quality:   Fair Exam Date:   11/28/2019 9:45 AM Site Location:   [facility] Exam Location:   [facility] Admit Date:   11/27/2019  Exam Type:   ECHOCARDIOGRAM W COLORFLOW SPECTRAL DOPPLER W CONTRAST  Study Info Indications    - history of hfref, pre-operative assessment  Complete two-dimensional, color flow and Doppler transthoracic echocardiogram is performed with contrast to opacify the left ventricle and to improve the delineation of the left ventricle endocardial borders.  Staff Referring Physician:   SELF, REFERRED ; Sonographer:   Lina Sar Ordering Physician:   Leonard Schwartz  Account #:   [id number]  Contrast/Agitated Saline  ------------------------------ Contrast/Ag. Saline:   Lumason Amount:   --- ml   Summary  1. The left ventricle is mildly dilated in size with upper normal wall thickness.  2. The left ventricular systolic function is moderately decreased, LVEF is visually estimated at 35-40%.  3. There is mild mitral valve regurgitation.  4. There is mild aortic regurgitation.  5. There is mild aortic valve stenosis.  6. The left atrium is moderately dilated in size.  7. The right ventricle is mildly dilated in size, with moderately reduced systolic function.  8. There is mild to moderate tricuspid regurgitation.  9. There is moderate pulmonary hypertension, estimated pulmonary artery systolic pressure is 46 mmHg.  10. The right atrium is mildly dilated in size.  11. The ascending aorta is mildly dilated.  12. IVC size and  inspiratory change suggest elevated right atrial pressure. (10-20 mmHg).   Left Ventricle  The left ventricle is mildly dilated in size with upper normal wall thickness.  The left ventricular systolic function is moderately decreased, LVEF is visually estimated at 35-40%.  Left ventricular diastolic function cannot be accurately assessed.  Right Ventricle  The right ventricle is mildly dilated in size, with moderately reduced systolic function.  Ventricular Septum  Abnormal ventricular septal motion consistent with left bundle branch block.   Left Atrium  The left atrium is moderately dilated in size.  Right Atrium  The right atrium is mildly dilated in size.   Aortic Valve  The aortic valve is moderately thickened leaflets with reduced excursion.  There is mild aortic regurgitation.  There is mild aortic valve stenosis.  Peak transvalvular velocity: 2.3 m/s.  Mean gradient: 12 mmHg.  Pulmonic Valve  The pulmonic valve is poorly visualized, but probably normal.  Mitral Valve  The mitral valve leaflets are poorly visualized but probably normal with probably normal leaflet mobility.  There is mild mitral valve regurgitation.  Tricuspid Valve  The tricuspid valve leaflets are poorly visualized but probably normal, with probably normal leaflet mobility.  There is mild to moderate tricuspid regurgitation.  There is moderate pulmonary hypertension, estimated pulmonary artery systolic pressure is 46 mmHg.   Other Findings  Rhythm: Atrial Fibrillation.  Pericardium/Pleural  There is no pericardial effusion.  Inferior Vena Cava  IVC size and inspiratory change suggest elevated right atrial pressure. (10-20 mmHg).  Aorta  The ascending aorta is  mildly dilated.   Left Ventricular Outflow Tract ---------------------------------------------------------------------- Name                 Value     Normal ----------------------------------------------------------------------  LVOT 2D ---------------------------------------------------------------------- LVOT Diameter            2.4 cm         LVOT Area             4.5 cm2          LVOT Doppler ---------------------------------------------------------------------- LVOT Peak Velocity         1.1 m/s         LVOT VTI               22 cm         LVOT Stroke Volume         101 ml  Pulmonic Valve ---------------------------------------------------------------------- Name                 Value    Normal ----------------------------------------------------------------------  PV Doppler ---------------------------------------------------------------------- PV Peak Velocity          1.1 m/s  Mitral Valve ---------------------------------------------------------------------- Name                 Value    Normal ----------------------------------------------------------------------  MV Regurgitation Doppler ---------------------------------------------------------------------- MR Peak Velocity          5.2 m/s          MV Diastolic Function ---------------------------------------------------------------------- MV E Peak Velocity         88 cm/s         MV A Peak Velocity         26 cm/s         MV E/A                 3.4          MV Annular TDI ---------------------------------------------------------------------- MV Septal e' Velocity      10.9 cm/s     >=8.0  MV Lateral e' Velocity      15.7 cm/s    >=10.0  MV e' Average             13.3         MV E/e' (Average)           6.8  Tricuspid Valve ---------------------------------------------------------------------- Name                  Value    Normal ----------------------------------------------------------------------  TV Regurgitation Doppler ---------------------------------------------------------------------- TR Peak Velocity           3 m/s          Estimated PAP/RSVP ---------------------------------------------------------------------- RA Pressure            15 mmHg      <=5  RV Systolic Pressure        46 mmHg      <36  Aorta ---------------------------------------------------------------------- Name                 Value    Normal ----------------------------------------------------------------------  Ascending Aorta ---------------------------------------------------------------------- Ao Root Diameter (2D)        3.6 cm         Ao Root Diam Index (2D)     16.1 cm/m2         Prox Asc Ao Diameter        4.1 cm    2.6-3.4  Prox Asc Ao Diameter Index   18.3 cm/m2   13.0-17.0  Asc Ao Diameter           4.3 cm  Venous ---------------------------------------------------------------------- Name                 Value    Normal ----------------------------------------------------------------------  IVC/SVC ---------------------------------------------------------------------- IVC Diameter (Exp 2D)        2.5 cm     <=2.1  Aortic Valve ---------------------------------------------------------------------- Name                 Value    Normal ----------------------------------------------------------------------  AV Doppler ---------------------------------------------------------------------- AV Peak Velocity          2.3 m/s         AV Mean Gradient          12 mmHg         AV VTI                44 cm         AV Area (Cont Eq VTI)       2.3 cm2      >=3.0  AV Area Index (Cont Eq VTI)   1.0 cm2/m2         AV Area (Cont Eq Vel)       2.2 cm2         AV Area Index (Cont Eq Vel)   1.0 cm2/m2         AV V1/V2 Ratio            0.49  Ventricles ---------------------------------------------------------------------- Name                 Value    Normal ----------------------------------------------------------------------  LV Dimensions 2D/MM ---------------------------------------------------------------------- IVS Diastolic Thickness (2D)    1.2 cm    0.6-1.0  LVID Diastole (2D)         5.7 cm    99991111  LVPW Diastolic Thickness (2D)                1.1 cm    0.6-1.0  LVID Systole (2D)          4.6 cm    2.5-4.0  LVOT Diameter            2.4 cm          RV Dimensions 2D/MM ---------------------------------------------------------------------- RV Basal Diastolic Dimension    3.9 cm    2.5-4.1  TAPSE                0.9 cm     >=1.7  Atria ---------------------------------------------------------------------- Name                 Value    Normal ----------------------------------------------------------------------  LA Dimensions ---------------------------------------------------------------------- LA Dimension (2D)          5.2 cm    3.0-4.1   RA Dimensions ---------------------------------------------------------------------- RA Area (4C)           21.4 cm2    <=18.0  RA Area (4C) Index       9.6 cm2/m2   Report Signatures Finalized by Stevan Born MD on 11/28/2019 06:59 PM Resident Kennith Maes MD on 11/28/2019 04:35 PM  Other Result Information  Interface, Rad Results In - 11/28/2019  7:00 PM EDT Patient Info Name:     Cameron Parrish Age:     61 years DOB:     02/04/1941 Gender:     Male MRN:      [id number] Accession #:     [id number] UN Ht:     183 cm  Wt:     97 kg BSA:     2.24 m2 HR:     83 bpm BP:     170 /     79 mmHg Technical Quality:     Fair Exam Date:     11/28/2019 9:45 AM Site Location:     [facility] Exam Location:     [facility] Admit Date:     11/27/2019  Exam Type:     ECHOCARDIOGRAM W COLORFLOW SPECTRAL DOPPLER W CONTRAST  Study Info Indications      - history of hfref,  pre-operative assessment  Complete two-dimensional, color flow and Doppler transthoracic echocardiogram is performed with contrast to opacify the left ventricle and to improve the delineation of the left ventricle endocardial borders.  Staff Referring Physician:     SELF, REFERRED  ; Sonographer:     Lina Sar Ordering Physician:     Leonard Schwartz  Account #:     [id number]  Contrast/Agitated Saline  ------------------------------ Contrast/Ag. Saline:     Lumason Amount:     --- ml   Summary   1. The left ventricle is mildly dilated in size with upper normal wall thickness.   2. The left ventricular systolic function is moderately decreased, LVEF is visually estimated at 35-40%.   3. There is mild mitral valve regurgitation.   4. There is mild aortic regurgitation.   5. There is mild aortic valve stenosis.   6. The left atrium is moderately dilated in size.   7. The right ventricle is mildly dilated in size, with moderately reduced systolic function.   8. There is mild to moderate tricuspid regurgitation.   9. There is moderate pulmonary hypertension, estimated pulmonary artery systolic pressure is 46 mmHg.   10. The right atrium is mildly dilated  in size.   11. The ascending aorta is mildly dilated.   12. IVC size and inspiratory change suggest elevated right atrial pressure. (10-20 mmHg).   Left Ventricle   The left ventricle is mildly dilated in size with upper normal wall thickness.   The left ventricular systolic function is moderately decreased, LVEF  is visually estimated at 35-40%.   Left ventricular diastolic function cannot be accurately assessed.  Right Ventricle   The right ventricle is mildly dilated in size, with moderately reduced systolic function.  Ventricular Septum   Abnormal ventricular septal motion consistent with left bundle branch block.   Left Atrium   The left atrium is moderately dilated in size.  Right Atrium   The right atrium is mildly dilated  in size.   Aortic Valve   The aortic valve is moderately thickened leaflets with reduced excursion.   There is mild aortic regurgitation.   There is mild aortic valve stenosis.   Peak transvalvular velocity:  2.3 m/s.   Mean gradient: 12 mmHg.  Pulmonic Valve   The pulmonic valve is poorly visualized, but probably normal.  Mitral Valve   The mitral valve leaflets are poorly visualized but probably normal with probably normal leaflet mobility.   There is mild mitral valve regurgitation.  Tricuspid Valve   The tricuspid valve leaflets are poorly visualized but probably normal, with probably normal leaflet mobility.   There is mild to moderate tricuspid regurgitation.   There is moderate pulmonary hypertension, estimated pulmonary artery systolic pressure is 46 mmHg.   Other Findings   Rhythm: Atrial Fibrillation.  Pericardium/Pleural   There is no pericardial effusion.  Inferior Vena Cava   IVC size  and inspiratory change suggest elevated right atrial pressure. (10-20 mmHg).  Aorta   The ascending aorta is mildly dilated.   Left Ventricular Outflow Tract ---------------------------------------------------------------------- Name                                 Value        Normal ----------------------------------------------------------------------  LVOT 2D ---------------------------------------------------------------------- LVOT Diameter                       2.4 cm                LVOT Area                          4.5 cm2                  LVOT Doppler ---------------------------------------------------------------------- LVOT Peak Velocity                 1.1 m/s                LVOT VTI                             22 cm                LVOT Stroke Volume                  101 ml  Pulmonic Valve ---------------------------------------------------------------------- Name                                 Value        Normal ----------------------------------------------------------------------  PV Doppler ---------------------------------------------------------------------- PV Peak Velocity                   1.1 m/s  Mitral Valve ---------------------------------------------------------------------- Name                                 Value        Normal ----------------------------------------------------------------------  MV Regurgitation Doppler ---------------------------------------------------------------------- MR Peak Velocity                   5.2 m/s                 MV Diastolic Function ---------------------------------------------------------------------- MV E Peak Velocity                 88 cm/s                MV A Peak Velocity                 26 cm/s                MV E/A                                 3.4                 MV Annular TDI ---------------------------------------------------------------------- MV Septal e' Velocity            10.9 cm/s         >=8.0  MV Lateral e' Velocity  15.7 cm/s        >=10.0  MV e' Average                         13.3                MV E/e' (Average)                      6.8  Tricuspid Valve ---------------------------------------------------------------------- Name                                 Value        Normal ----------------------------------------------------------------------  TV Regurgitation Doppler ---------------------------------------------------------------------- TR Peak Velocity                     3 m/s                  Estimated PAP/RSVP ---------------------------------------------------------------------- RA Pressure                        15 mmHg           <=5  RV Systolic Pressure               46 mmHg           <36  Aorta ---------------------------------------------------------------------- Name                                 Value        Normal ----------------------------------------------------------------------  Ascending Aorta ---------------------------------------------------------------------- Ao Root Diameter (2D)               3.6 cm                Ao Root Diam Index (2D)         16.1 cm/m2                Prox Asc Ao Diameter                4.1 cm       2.6-3.4  Prox Asc Ao Diameter Index      18.3 cm/m2     13.0-17.0  Asc Ao Diameter                     4.3 cm  Venous ---------------------------------------------------------------------- Name                                 Value        Normal ----------------------------------------------------------------------  IVC/SVC ---------------------------------------------------------------------- IVC Diameter (Exp 2D)               2.5 cm         <=2.1  Aortic Valve ---------------------------------------------------------------------- Name                                 Value        Normal ----------------------------------------------------------------------  AV Doppler ---------------------------------------------------------------------- AV Peak Velocity                   2.3 m/s                AV  Mean Gradient                   12 mmHg                AV VTI                               44 cm                AV Area (Cont Eq VTI)              2.3 cm2         >=3.0  AV Area Index (Cont Eq VTI)     1.0 cm2/m2                AV Area (Cont Eq Vel)              2.2 cm2                AV Area Index (Cont Eq Vel)     1.0 cm2/m2                AV V1/V2 Ratio                         0.49  Ventricles ---------------------------------------------------------------------- Name                                 Value        Normal ----------------------------------------------------------------------  LV Dimensions 2D/MM ---------------------------------------------------------------------- IVS Diastolic Thickness (2D)        1.2 cm       0.6-1.0  LVID Diastole (2D)                  5.7 cm       99991111  LVPW Diastolic Thickness (2D)                                1.1 cm       0.6-1.0  LVID Systole (2D)                   4.6 cm       2.5-4.0  LVOT Diameter                       2.4 cm                 RV Dimensions 2D/MM ---------------------------------------------------------------------- RV Basal Diastolic Dimension        3.9 cm       2.5-4.1  TAPSE                               0.9 cm         >=1.7  Atria ---------------------------------------------------------------------- Name                                 Value        Normal ----------------------------------------------------------------------  LA Dimensions ---------------------------------------------------------------------- LA Dimension (2D)                   5.2 cm       3.0-4.1  RA Dimensions ---------------------------------------------------------------------- RA Area (4C)                      21.4 cm2        <=18.0  RA Area (4C) Index              9.6 cm2/m2   Report Signatures Finalized by Stevan Born  MD on 11/28/2019 06:59 PM Resident Kennith Maes  MD on 11/28/2019 04:35 PM            ECG 12 Lead3/16/2021 [place] Component Name Value Ref Range  EKG Systolic BP  mmHg  EKG Diastolic BP  mmHg  EKG Ventricular Rate 67 BPM  EKG Atrial Rate 75 BPM  EKG P-R Interval  ms  EKG QRS Duration 168 ms  EKG Q-T Interval 450 ms  EKG QTC Calculation 475 ms  EKG Calculated P Axis  degrees  EKG Calculated R Axis -28 degrees  EKG Calculated T Axis 113 degrees  QTC  Fredericia 467 ms  Result Narrative  ATRIAL FIBRILLATION LEFT BUNDLE BRANCH BLOCK  Confirmed by Dimas Alexandria [phone]) on 11/27/2019 2:02:07 PM  Other Result Information  Interface, Rad Results In - 11/27/2019  2:02 PM EDT ATRIAL FIBRILLATION LEFT BUNDLE BRANCH BLOCK  Confirmed by Dimas Alexandria [phone]) on 11/27/2019 2:02:07 PM

## 2019-12-13 ENCOUNTER — Other Ambulatory Visit
Admission: RE | Admit: 2019-12-13 | Discharge: 2019-12-13 | Disposition: A | Discharge status: Home / Self Care | Payer: Medicare Other | Source: Ambulatory Visit | Attending: General Surgery | Admitting: General Surgery

## 2019-12-13 DIAGNOSIS — Z20822 Contact with and (suspected) exposure to covid-19: Secondary | ICD-10-CM | POA: Insufficient documentation

## 2019-12-13 DIAGNOSIS — Z01812 Encounter for preprocedural laboratory examination: Secondary | ICD-10-CM | POA: Insufficient documentation

## 2019-12-13 LAB — SARS CORONAVIRUS 2 (TAT 6-24 HRS): SARS Coronavirus 2: NEGATIVE

## 2019-12-13 NOTE — Pre-Procedure Instructions (Signed)
Cardiac clearance on chart from Dr Ubaldo Glassing. Medium Risk

## 2019-12-17 ENCOUNTER — Ambulatory Visit
Admission: RE | Admit: 2019-12-17 | Discharge: 2019-12-17 | Disposition: A | Discharge status: Home / Self Care | Payer: Medicare Other | Attending: General Surgery | Admitting: General Surgery

## 2019-12-17 ENCOUNTER — Ambulatory Visit: Payer: Medicare Other

## 2019-12-17 ENCOUNTER — Ambulatory Visit: Payer: Medicare Other | Admitting: Certified Registered Nurse Anesthetist

## 2019-12-17 ENCOUNTER — Encounter
Admission: RE | Disposition: A | Discharge status: Home / Self Care | Payer: Self-pay | Source: Home / Self Care | Attending: General Surgery

## 2019-12-17 ENCOUNTER — Other Ambulatory Visit: Payer: Self-pay

## 2019-12-17 ENCOUNTER — Encounter: Payer: Self-pay | Admitting: General Surgery

## 2019-12-17 DIAGNOSIS — Z885 Allergy status to narcotic agent status: Secondary | ICD-10-CM | POA: Diagnosis not present

## 2019-12-17 DIAGNOSIS — Z7982 Long term (current) use of aspirin: Secondary | ICD-10-CM | POA: Diagnosis not present

## 2019-12-17 DIAGNOSIS — I2510 Atherosclerotic heart disease of native coronary artery without angina pectoris: Secondary | ICD-10-CM | POA: Diagnosis not present

## 2019-12-17 DIAGNOSIS — Z95820 Peripheral vascular angioplasty status with implants and grafts: Secondary | ICD-10-CM | POA: Insufficient documentation

## 2019-12-17 DIAGNOSIS — I4891 Unspecified atrial fibrillation: Secondary | ICD-10-CM | POA: Insufficient documentation

## 2019-12-17 DIAGNOSIS — Z85828 Personal history of other malignant neoplasm of skin: Secondary | ICD-10-CM | POA: Insufficient documentation

## 2019-12-17 DIAGNOSIS — Z880 Allergy status to penicillin: Secondary | ICD-10-CM | POA: Insufficient documentation

## 2019-12-17 DIAGNOSIS — Z8719 Personal history of other diseases of the digestive system: Secondary | ICD-10-CM | POA: Diagnosis not present

## 2019-12-17 DIAGNOSIS — Z87891 Personal history of nicotine dependence: Secondary | ICD-10-CM | POA: Diagnosis not present

## 2019-12-17 DIAGNOSIS — K8012 Calculus of gallbladder with acute and chronic cholecystitis without obstruction: Secondary | ICD-10-CM | POA: Diagnosis not present

## 2019-12-17 DIAGNOSIS — I252 Old myocardial infarction: Secondary | ICD-10-CM | POA: Insufficient documentation

## 2019-12-17 DIAGNOSIS — Z79899 Other long term (current) drug therapy: Secondary | ICD-10-CM | POA: Diagnosis not present

## 2019-12-17 DIAGNOSIS — Z91013 Allergy to seafood: Secondary | ICD-10-CM | POA: Insufficient documentation

## 2019-12-17 DIAGNOSIS — I10 Essential (primary) hypertension: Secondary | ICD-10-CM | POA: Diagnosis not present

## 2019-12-17 DIAGNOSIS — Z87892 Personal history of anaphylaxis: Secondary | ICD-10-CM | POA: Insufficient documentation

## 2019-12-17 DIAGNOSIS — Z888 Allergy status to other drugs, medicaments and biological substances status: Secondary | ICD-10-CM | POA: Diagnosis not present

## 2019-12-17 DIAGNOSIS — Z85048 Personal history of other malignant neoplasm of rectum, rectosigmoid junction, and anus: Secondary | ICD-10-CM | POA: Diagnosis not present

## 2019-12-17 DIAGNOSIS — Z419 Encounter for procedure for purposes other than remedying health state, unspecified: Secondary | ICD-10-CM

## 2019-12-17 DIAGNOSIS — Z951 Presence of aortocoronary bypass graft: Secondary | ICD-10-CM | POA: Diagnosis not present

## 2019-12-17 DIAGNOSIS — K8020 Calculus of gallbladder without cholecystitis without obstruction: Secondary | ICD-10-CM | POA: Diagnosis present

## 2019-12-17 HISTORY — PX: CHOLECYSTECTOMY: SHX55

## 2019-12-17 SURGERY — LAPAROSCOPIC CHOLECYSTECTOMY WITH INTRAOPERATIVE CHOLANGIOGRAM
Anesthesia: General | Site: Abdomen

## 2019-12-17 MED ORDER — CEFAZOLIN SODIUM-DEXTROSE 2-4 GM/100ML-% IV SOLN: 2.0000 g | INTRAVENOUS | Status: DC

## 2019-12-17 MED ORDER — DEXMEDETOMIDINE HCL 200 MCG/2ML IV SOLN
INTRAVENOUS | Status: DC | PRN
  Administered 2019-12-17: 8 ug via INTRAVENOUS
  Administered 2019-12-17: 4 ug via INTRAVENOUS

## 2019-12-17 MED ORDER — CEFAZOLIN SODIUM-DEXTROSE 2-4 GM/100ML-% IV SOLN
INTRAVENOUS | Status: AC
  Filled 2019-12-17: qty 100

## 2019-12-17 MED ORDER — PROPOFOL 10 MG/ML IV BOLUS
INTRAVENOUS | Status: DC | PRN
  Administered 2019-12-17: 80 mg via INTRAVENOUS

## 2019-12-17 MED ORDER — ACETAMINOPHEN 10 MG/ML IV SOLN
INTRAVENOUS | Status: DC | PRN
  Administered 2019-12-17: 1000 mg via INTRAVENOUS

## 2019-12-17 MED ORDER — ACETAMINOPHEN 10 MG/ML IV SOLN
INTRAVENOUS | Status: AC
  Filled 2019-12-17: qty 100

## 2019-12-17 MED ORDER — FENTANYL CITRATE (PF) 100 MCG/2ML IJ SOLN
INTRAMUSCULAR | Status: DC | PRN
  Administered 2019-12-17 (×2): 50 ug via INTRAVENOUS

## 2019-12-17 MED ORDER — FENTANYL CITRATE (PF) 100 MCG/2ML IJ SOLN
INTRAMUSCULAR | Status: AC
  Filled 2019-12-17: qty 2

## 2019-12-17 MED ORDER — FAMOTIDINE 20 MG PO TABS
ORAL_TABLET | ORAL | Status: AC
  Administered 2019-12-17: 09:00:00 20 mg via ORAL
  Filled 2019-12-17: qty 1

## 2019-12-17 MED ORDER — DEXAMETHASONE SODIUM PHOSPHATE 10 MG/ML IJ SOLN
INTRAMUSCULAR | Status: DC | PRN
  Administered 2019-12-17: 10 mg via INTRAVENOUS

## 2019-12-17 MED ORDER — LACTATED RINGERS IV SOLN: INTRAVENOUS | Status: DC

## 2019-12-17 MED ORDER — ONDANSETRON HCL 4 MG/2ML IJ SOLN: 4.0000 mg | Freq: Once | INTRAMUSCULAR | Status: DC | PRN

## 2019-12-17 MED ORDER — EPHEDRINE SULFATE 50 MG/ML IJ SOLN
INTRAMUSCULAR | Status: DC | PRN
  Administered 2019-12-17: 5 mg via INTRAVENOUS

## 2019-12-17 MED ORDER — FAMOTIDINE 20 MG PO TABS: 20.0000 mg | ORAL_TABLET | Freq: Once | ORAL | Status: AC

## 2019-12-17 MED ORDER — CIPROFLOXACIN IN D5W 400 MG/200ML IV SOLN
400.0000 mg | Freq: Once | INTRAVENOUS | Status: AC
  Administered 2019-12-17: 400 mg via INTRAVENOUS

## 2019-12-17 MED ORDER — SUGAMMADEX SODIUM 200 MG/2ML IV SOLN
INTRAVENOUS | Status: DC | PRN
  Administered 2019-12-17: 200 mg via INTRAVENOUS

## 2019-12-17 MED ORDER — SODIUM CHLORIDE 0.9 % IV SOLN
INTRAVENOUS | Status: DC | PRN
  Administered 2019-12-17: 11:00:00 30 mL

## 2019-12-17 MED ORDER — ROCURONIUM BROMIDE 100 MG/10ML IV SOLN
INTRAVENOUS | Status: DC | PRN
Start: 2019-12-17 — End: 2019-12-17
  Administered 2019-12-17: 10 mg via INTRAVENOUS
  Administered 2019-12-17: 20 mg via INTRAVENOUS
  Administered 2019-12-17: 50 mg via INTRAVENOUS

## 2019-12-17 MED ORDER — PHENYLEPHRINE HCL (PRESSORS) 10 MG/ML IV SOLN
INTRAVENOUS | Status: DC | PRN
  Administered 2019-12-17: 200 ug via INTRAVENOUS
  Administered 2019-12-17 (×3): 100 ug via INTRAVENOUS

## 2019-12-17 MED ORDER — MIDAZOLAM HCL 2 MG/2ML IJ SOLN
INTRAMUSCULAR | Status: AC
  Filled 2019-12-17: qty 2

## 2019-12-17 MED ORDER — LIDOCAINE HCL (CARDIAC) PF 100 MG/5ML IV SOSY
PREFILLED_SYRINGE | INTRAVENOUS | Status: DC | PRN
  Administered 2019-12-17: 60 mg via INTRAVENOUS

## 2019-12-17 MED ORDER — SODIUM CHLORIDE (PF) 0.9 % IJ SOLN
INTRAMUSCULAR | Status: AC
  Filled 2019-12-17: qty 50

## 2019-12-17 MED ORDER — FENTANYL CITRATE (PF) 100 MCG/2ML IJ SOLN: 25.0000 ug | INTRAMUSCULAR | Status: DC | PRN

## 2019-12-17 MED ORDER — ONDANSETRON HCL 4 MG/2ML IJ SOLN
INTRAMUSCULAR | Status: DC | PRN
Start: 2019-12-17 — End: 2019-12-17
  Administered 2019-12-17: 4 mg via INTRAVENOUS

## 2019-12-17 MED ORDER — CIPROFLOXACIN IN D5W 400 MG/200ML IV SOLN
INTRAVENOUS | Status: AC
  Filled 2019-12-17: qty 200

## 2019-12-17 MED ORDER — PROPOFOL 10 MG/ML IV BOLUS
INTRAVENOUS | Status: AC
  Filled 2019-12-17: qty 40

## 2019-12-17 SURGICAL SUPPLY — 42 items
APPLIER CLIP ROT 10 11.4 M/L (STAPLE) ×3
BLADE SURG 11 STRL SS SAFETY (MISCELLANEOUS) ×3 IMPLANT
CANISTER SUCT 1200ML W/VALVE (MISCELLANEOUS) ×3 IMPLANT
CANNULA DILATOR  5MM W/SLV (CANNULA) ×2
CANNULA DILATOR 10 W/SLV (CANNULA) ×2 IMPLANT
CANNULA DILATOR 10MM W/SLV (CANNULA) ×1
CANNULA DILATOR 5 W/SLV (CANNULA) ×4 IMPLANT
CATH CHOLANG 76X19 KUMAR (CATHETERS) ×3 IMPLANT
CHLORAPREP W/TINT 26 (MISCELLANEOUS) ×3 IMPLANT
CLIP APPLIE ROT 10 11.4 M/L (STAPLE) ×1 IMPLANT
CLOSURE WOUND 1/2 X4 (GAUZE/BANDAGES/DRESSINGS) ×1
CONRAY 60ML FOR OR (MISCELLANEOUS) ×3 IMPLANT
COVER WAND RF STERILE (DRAPES) ×3 IMPLANT
DISSECTOR KITTNER STICK (MISCELLANEOUS) ×1 IMPLANT
DISSECTORS/KITTNER STICK (MISCELLANEOUS) ×3
DRAPE 3/4 80X56 (DRAPES) ×3 IMPLANT
DRSG TEGADERM 2-3/8X2-3/4 SM (GAUZE/BANDAGES/DRESSINGS) ×12 IMPLANT
DRSG TELFA 4X3 1S NADH ST (GAUZE/BANDAGES/DRESSINGS) ×3 IMPLANT
ELECT REM PT RETURN 9FT ADLT (ELECTROSURGICAL) ×3
ELECTRODE REM PT RTRN 9FT ADLT (ELECTROSURGICAL) ×1 IMPLANT
GLOVE BIO SURGEON STRL SZ7.5 (GLOVE) ×3 IMPLANT
GLOVE INDICATOR 8.0 STRL GRN (GLOVE) ×3 IMPLANT
GOWN STRL REUS W/ TWL LRG LVL3 (GOWN DISPOSABLE) ×3 IMPLANT
GOWN STRL REUS W/TWL LRG LVL3 (GOWN DISPOSABLE) ×6
IRRIGATION STRYKERFLOW (MISCELLANEOUS) ×1 IMPLANT
IRRIGATOR STRYKERFLOW (MISCELLANEOUS) ×3
IV LACTATED RINGERS 1000ML (IV SOLUTION) ×3 IMPLANT
KIT TURNOVER KIT A (KITS) ×3 IMPLANT
LABEL OR SOLS (LABEL) ×3 IMPLANT
NDL INSUFF ACCESS 14 VERSASTEP (NEEDLE) ×3 IMPLANT
NS IRRIG 500ML POUR BTL (IV SOLUTION) ×3 IMPLANT
PACK LAP CHOLECYSTECTOMY (MISCELLANEOUS) ×3 IMPLANT
POUCH SPECIMEN RETRIEVAL 10MM (ENDOMECHANICALS) ×3 IMPLANT
SCISSORS METZENBAUM CVD 33 (INSTRUMENTS) ×3 IMPLANT
SET TUBE SMOKE EVAC HIGH FLOW (TUBING) ×3 IMPLANT
STRIP CLOSURE SKIN 1/2X4 (GAUZE/BANDAGES/DRESSINGS) ×2 IMPLANT
SUT VIC AB 0 CT2 27 (SUTURE) ×3 IMPLANT
SUT VIC AB 4-0 FS2 27 (SUTURE) ×3 IMPLANT
SWABSTK COMLB BENZOIN TINCTURE (MISCELLANEOUS) ×3 IMPLANT
TROCAR XCEL NON-BLD 11X100MML (ENDOMECHANICALS) ×3 IMPLANT
TROCAR XCEL UNIV SLVE 11M 100M (ENDOMECHANICALS) ×3 IMPLANT
WATER STERILE IRR 1000ML POUR (IV SOLUTION) ×3 IMPLANT

## 2019-12-17 NOTE — Op Note (Signed)
Preoperative diagnosis: Previous gallstone pancreatitis, cholelithiasis.  Postoperative diagnosis: Same.  Operative procedure: Laparoscopic cholecystectomy with intraoperative cholangiograms.  Operating surgeon: Hervey Ard, MD.  Assistant: Arvilla Meres, RNFA.  Anesthesia: General endotracheal.  Estimated blood loss: 5 cc.  Clinical note: This 79 year old male had an episode of gallstone pancreatitis about 2-3 weeks ago treated by urgent ERCP at [facility].  He made a rapid recovery and is admitted at this time for elective cholecystectomy due to the identification of multiple stones within the gallbladder.  The patient had SCD stockings for DVT prevention.  He received Cipro IV for antibiotic prophylaxis due to a history of penicillin anaphylaxis.  Operative note: With the patient under adequate general endotracheal anesthesia the abdomen was cleansed with ChloraPrep and draped.  The patient was placed in Trendelenburg position and a varies needle Plast through a transumbilical incision.  After assuring intra-abdominal location with a hanging drop test the abdomen was insufflated with CO2 at 10 mmHg pressure.  A 10 mm Step port was expanded and inspection showed no evidence of injury from initial port placement.  An 11 mm XL port was placed in the epigastrium under direct vision.  The gallbladder was noted to be mildly distended and showed evidence of chronic inflammation especially near the body and neck.  2-5 mm Ports were placed in the right lateral abdominal wall.  The gallbladder was placed on cephalad traction and adhesions between the omentum and the undersurface the gallbladder taken down with cautery dissection.  The neck of the gallbladder was cleared.  The cystic artery branch overlaid the cystic duct.  This was doubly clipped and divided.  A Kumar clamp was placed and fluoroscopic cholangiograms obtained.  32 cc of one half strength Conray 60 was utilized.  This showed a long  corkscrew cystic duct and prompt reflux into the hepatic duct and free flow into the duodenum.  Distortion near the distal common duct was thought secondary to his recent ERCP.  No clear evidence of retained stones.  The cystic duct was doubly clipped and divided.  The gallbladder was removed from the liver bed making use of hook cautery dissection.  An accessory cystic artery near the mid body of the gallbladder was doubly clipped and divided.  After the gallbladder was removed from the liver bed it was placed into an Endo Catch bag and delivered to the umbilical port site.  Inspection from the epigastric site showed no evidence of injury from initial port placement.  It was necessary to expand the umbilical incision slightly to extract the thick-walled gallbladder and stones.  After this was completed pneumoperitoneum was reestablished and inspection showed good hemostasis in the right upper quadrant.  This was irrigated with lactated Ringer solution.  The abdomen was then desufflated and ports removed under direct vision.  The fascia at the umbilicus was closed with a 0 Vicryl figure-of-eight suture and that at the epigastric site with a simple 0 Vicryl suture.  Skin incisions were closed with a 4-0 Vicryl subcuticular suture.  Benzoin, Steri-Strips, Telfa and Tegaderm dressing was then applied.  The patient tolerated the procedure well and was taken to recovery room in stable condition.

## 2019-12-17 NOTE — Anesthesia Preprocedure Evaluation (Addendum)
Anesthesia Evaluation  Patient identified by MRN, date of birth, ID band Patient awake    Reviewed: Allergy & Precautions, NPO status , Patient's Chart, lab work & pertinent test results  Airway Mallampati: I       Dental  (+) Upper Dentures, Lower Dentures   Pulmonary former smoker,    breath sounds clear to auscultation       Cardiovascular hypertension, Pt. on medications and Pt. on home beta blockers + CAD, + Past MI and + CABG  + dysrhythmias Atrial Fibrillation  Rhythm:Irregular Rate:Normal     Neuro/Psych negative neurological ROS  negative psych ROS   GI/Hepatic Neg liver ROS,   Endo/Other  negative endocrine ROS  Renal/GU negative Renal ROS  negative genitourinary   Musculoskeletal   Abdominal (+) + obese,  Abdomen: soft.    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%  Reproductive/Obstetrics                            Anesthesia Physical  Anesthesia Plan  ASA: IV  Anesthesia Plan: General   Post-op Pain Management:    Induction: Intravenous  PONV Risk Score and Plan:   Airway Management Planned: Oral ETT  Additional Equipment:   Intra-op Plan:   Post-operative Plan: Extubation in OR  Informed Consent: I have reviewed the patients History and Physical, chart, labs and discussed the procedure including the risks, benefits and alternatives for the proposed anesthesia with the patient or authorized representative who has indicated his/her understanding and acceptance.       Plan Discussed with: CRNA  Anesthesia Plan  Comments:        Anesthesia Quick Evaluation

## 2019-12-17 NOTE — Anesthesia Procedure Notes (Signed)
Procedure Name: Intubation Date/Time: 12/17/2019 10:03 AM Performed by: Louann Sjogren, CRNA Pre-anesthesia Checklist: Patient identified, Patient being monitored, Timeout performed, Emergency Drugs available and Suction available Patient Re-evaluated:Patient Re-evaluated prior to induction Oxygen Delivery Method: Circle system utilized Preoxygenation: Pre-oxygenation with 100% oxygen Induction Type: IV induction Ventilation: Mask ventilation without difficulty Laryngoscope Size: Mac and 4 Grade View: Grade I Tube type: Oral Tube size: 7.5 mm Number of attempts: 1 Airway Equipment and Method: Stylet Placement Confirmation: ETT inserted through vocal cords under direct vision,  positive ETCO2 and breath sounds checked- equal and bilateral Secured at: 21 cm Tube secured with: Tape Dental Injury: Teeth and Oropharynx as per pre-operative assessment

## 2019-12-17 NOTE — Transfer of Care (Signed)
Immediate Anesthesia Transfer of Care Note  Patient: Cameron Kerns Nappi Sr.  Procedure(s) Performed: LAPAROSCOPIC CHOLECYSTECTOMY WITH INTRAOPERATIVE CHOLANGIOGRAM (N/A Abdomen)  Patient Location: PACU  Anesthesia Type:General  Level of Consciousness: awake  Airway & Oxygen Therapy: Patient Spontanous Breathing  Post-op Assessment: Report given to RN  Post vital signs: Reviewed  Last Vitals:  Vitals Value Taken Time  BP 150/87 12/17/19 1135  Temp 36.1 C 12/17/19 1133  Pulse 65 12/17/19 1139  Resp 18 12/17/19 1139  SpO2 100 % 12/17/19 1139    Last Pain:  Vitals:   12/17/19 1133  TempSrc:   PainSc: 0-No pain         Complications: No apparent anesthesia complications

## 2019-12-17 NOTE — Anesthesia Postprocedure Evaluation (Signed)
Anesthesia Post Note  Patient: Sherren Kerns Kiesel Sr.  Procedure(s) Performed: LAPAROSCOPIC CHOLECYSTECTOMY WITH INTRAOPERATIVE CHOLANGIOGRAM (N/A Abdomen)  Patient location during evaluation: PACU Anesthesia Type: General Level of consciousness: awake and alert Pain management: pain level controlled Vital Signs Assessment: post-procedure vital signs reviewed and stable Respiratory status: spontaneous breathing, nonlabored ventilation and respiratory function stable Cardiovascular status: blood pressure returned to baseline and stable Postop Assessment: no apparent nausea or vomiting Anesthetic complications: no     Last Vitals:  Vitals:   12/17/19 1219 12/17/19 1241  BP: 113/79 134/63  Pulse: 78 60  Resp: 18 18  Temp: (!) 36.2 C   SpO2: 96% 98%    Last Pain:  Vitals:   12/17/19 1241  TempSrc:   PainSc: 0-No pain                 Tera Mater

## 2019-12-17 NOTE — Discharge Instructions (Signed)

## 2019-12-17 NOTE — H&P (Signed)
Cameron LARACUENTE Sr. [id number] 1940/12/05     HPI: 79 year old male with recent episode of biliary pancreatitis. Has mad a nice recovery. Plan for cholecystectomy to prevent recurrent episodes.    Medications Prior to Admission  Medication Sig Dispense Refill Last Dose  . aspirin 325 MG tablet Take 1 tablet (325 mg total) by mouth daily. 90 tablet 3 Past Month at Unknown time  . aspirin EC 81 MG tablet Take 81 mg by mouth daily.    12/16/2019 at Unknown time  . metoprolol tartrate (LOPRESSOR) 25 MG tablet Take 25 mg by mouth 2 (two) times daily.    12/17/2019 at Unknown time  . vitamin B-12 (CYANOCOBALAMIN) 1000 MCG tablet Take 1,000 mcg by mouth daily. 5 days a week. Monday through Friday.   Past Week at Unknown time   Allergies  Allergen Reactions  . Codeine Anaphylaxis  . Penicillins Anaphylaxis  . Shellfish Allergy Nausea And Vomiting  . Statins Other (See Comments)    Rhabdo   Past Medical History:  Diagnosis Date  . Aneurysm ([place])   . Atrial fibrillation ([place])   . 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 Coronary artery disease   . Memory loss    diff with finding right words  . Myocardial infarction [facility])    Past Surgical History:  Procedure Laterality Date  . CORONARY ARTERY BYPASS GRAFT    . PERIPHERAL VASCULAR CATHETERIZATION N/A 03/12/2015   Procedure: Endovascular Repair/Stent Graft;  Surgeon: Algernon Huxley, MD;  Location: [place] [facility];  Service: Cardiovascular;  Laterality: N/A;  . skin cancer removed    . trans anal ca removed    . vocal cord tumor removed Right    Social History   Socioeconomic History  . Marital status: Married    Spouse name: Not on file  . Number of children: Not on file  . Years of education: Not on file  . Highest education level: Not on file  Occupational History  . Not on file  Tobacco Use  . Smoking status: Never Smoker  . Smokeless tobacco: Never Used  Substance and Sexual Activity  . Alcohol use: No  . Drug use: No  .  Sexual activity: Yes  Other Topics Concern  . Not on file  Social History Narrative  . Not on file   Social Determinants of Health   Financial Resource Strain:   . Difficulty of Paying Living Expenses:   Food Insecurity:   . Worried About Charity fundraiser in the Last Year:   . Arboriculturist in the Last Year:   Transportation Needs:   . Film/video editor (Medical):   Marland Kitchen Lack of Transportation (Non-Medical):   Physical Activity:   . Days of Exercise per Week:   . Minutes of Exercise per Session:   Stress:   . Feeling of Stress :   Social Connections:   . Frequency of Communication with Friends and Family:   . Frequency of Social Gatherings with Friends and Family:   . Attends Religious Services:   . Active Member of Clubs or Organizations:   . Attends Archivist Meetings:   Marland Kitchen Marital Status:   Intimate Partner Violence:   . Fear of Current or Ex-Partner:   . Emotionally Abused:   Marland Kitchen Physically Abused:   . Sexually Abused:    Social History   Social History Narrative  . Not on file  ROS: Negative.     PE: HEENT: Negative. Lungs: Clear. Cardio: RR.   Assessment/Plan:  Proceed with planned cholecystectomy.    Forest Gleason [facility] 12/17/2019

## 2019-12-17 NOTE — OR Nursing (Signed)
Per Dr. Bary Castilla verbal, he does not need to see patient in postop prior to discharge.

## 2019-12-18 LAB — SURGICAL PATHOLOGY

## 2023-07-15 DEATH — deceased
